# Patient Record
Sex: Female | Born: 1970 | Race: Asian | Hispanic: No | Marital: Married
Health system: Midwestern US, Community
[De-identification: ages and names within clinical notes are randomized; demographics above are authoritative.]

## PROBLEM LIST (undated history)

## (undated) DIAGNOSIS — I679 Cerebrovascular disease, unspecified: Secondary | ICD-10-CM

## (undated) DIAGNOSIS — Z227 Latent tuberculosis: Secondary | ICD-10-CM

## (undated) DIAGNOSIS — I639 Cerebral infarction, unspecified: Secondary | ICD-10-CM

## (undated) DIAGNOSIS — G8929 Other chronic pain: Secondary | ICD-10-CM

## (undated) DIAGNOSIS — H509 Unspecified strabismus: Secondary | ICD-10-CM

## (undated) DIAGNOSIS — R519 Headache, unspecified: Secondary | ICD-10-CM

## (undated) HISTORY — PX: CHOLECYSTECTOMY: SHX55

## (undated) HISTORY — DX: Latent tuberculosis: Z22.7

## (undated) HISTORY — DX: Cerebrovascular disease, unspecified: I67.9

## (undated) HISTORY — DX: Headache, unspecified: R51.9

## (undated) HISTORY — DX: Unspecified strabismus: H50.9

## (undated) HISTORY — DX: Other chronic pain: G89.29

## (undated) HISTORY — DX: Cerebral infarction, unspecified: I63.9

---

## 2019-12-12 ENCOUNTER — Emergency Department: Admit: 2019-12-12 | Payer: PRIVATE HEALTH INSURANCE

## 2019-12-12 ENCOUNTER — Inpatient Hospital Stay
Admit: 2019-12-12 | Discharge: 2019-12-13 | Disposition: A | Discharge status: Home / Self Care | Payer: PRIVATE HEALTH INSURANCE | Attending: Emergency Medicine

## 2019-12-12 DIAGNOSIS — R4182 Altered mental status, unspecified: Secondary | ICD-10-CM

## 2019-12-12 LAB — CBC WITH AUTO DIFFERENTIAL
Basophils %: 0 % (ref 0–1)
Basophils Absolute: 0 10*3/uL (ref 0.0–0.1)
Eosinophils %: 1 % (ref 0–7)
Eosinophils Absolute: 0 10*3/uL (ref 0.0–0.4)
Granulocyte Absolute Count: 0 10*3/uL (ref 0.00–0.04)
Hematocrit: 37.5 % (ref 35.0–47.0)
Hemoglobin: 12.4 g/dL (ref 11.5–16.0)
Immature Granulocytes: 0 % (ref 0–0.5)
Lymphocytes %: 27 % (ref 12–49)
Lymphocytes Absolute: 1 10*3/uL (ref 0.8–3.5)
MCH: 29.1 PG (ref 26.0–34.0)
MCHC: 33.1 g/dL (ref 30.0–36.5)
MCV: 88 FL (ref 80.0–99.0)
MPV: 9.1 FL (ref 8.9–12.9)
Monocytes %: 6 % (ref 5–13)
Monocytes Absolute: 0.2 10*3/uL (ref 0.0–1.0)
NRBC Absolute: 0 10*3/uL (ref 0.00–0.01)
Neutrophils %: 66 % (ref 32–75)
Neutrophils Absolute: 2.4 10*3/uL (ref 1.8–8.0)
Nucleated RBCs: 0 PER 100 WBC
Platelets: 187 10*3/uL (ref 150–400)
RBC: 4.26 M/uL (ref 3.80–5.20)
RDW: 11.7 % (ref 11.5–14.5)
WBC: 3.7 10*3/uL (ref 3.6–11.0)

## 2019-12-12 LAB — COMPREHENSIVE METABOLIC PANEL
ALT: 31 U/L (ref 12–78)
ALT: 39 U/L (ref 12–78)
AST: 286 U/L — ABNORMAL HIGH (ref 15–37)
AST: 346 U/L — ABNORMAL HIGH (ref 15–37)
Albumin/Globulin Ratio: 0.8 — ABNORMAL LOW (ref 1.1–2.2)
Albumin/Globulin Ratio: 0.9 — ABNORMAL LOW (ref 1.1–2.2)
Albumin: 3.4 g/dL — ABNORMAL LOW (ref 3.5–5.0)
Albumin: 3.5 g/dL (ref 3.5–5.0)
Alkaline Phosphatase: 101 U/L (ref 45–117)
Alkaline Phosphatase: 111 U/L (ref 45–117)
Anion Gap: 7 mmol/L (ref 5–15)
Anion Gap: 7 mmol/L (ref 5–15)
BUN: 21 mg/dL — ABNORMAL HIGH (ref 6–20)
BUN: 22 mg/dL — ABNORMAL HIGH (ref 6–20)
Bun/Cre Ratio: 37 — ABNORMAL HIGH (ref 12–20)
Bun/Cre Ratio: 38 — ABNORMAL HIGH (ref 12–20)
CO2: 25 mmol/L (ref 21–32)
CO2: 25 mmol/L (ref 21–32)
Calcium: 8.5 mg/dL (ref 8.5–10.1)
Calcium: 8.8 mg/dL (ref 8.5–10.1)
Chloride: 103 mmol/L (ref 97–108)
Chloride: 111 mmol/L — ABNORMAL HIGH (ref 97–108)
Creatinine: 0.56 mg/dL (ref 0.55–1.02)
Creatinine: 0.6 mg/dL (ref 0.55–1.02)
EGFR IF NonAfrican American: >60 mL/min/{1.73_m2} (ref >60)
EGFR IF NonAfrican American: >60 mL/min/{1.73_m2} (ref >60)
GFR African American: >60 mL/min/{1.73_m2} (ref >60)
GFR African American: >60 mL/min/{1.73_m2} (ref >60)
Globulin: 3.8 g/dL (ref 2.0–4.0)
Globulin: 4.2 g/dL — ABNORMAL HIGH (ref 2.0–4.0)
Glucose: 92 mg/dL (ref 65–100)
Glucose: 96 mg/dL (ref 65–100)
Potassium: 3.7 mmol/L (ref 3.5–5.1)
Potassium: 4 mmol/L (ref 3.5–5.1)
Sodium: 135 mmol/L — ABNORMAL LOW (ref 136–145)
Sodium: 143 mmol/L (ref 136–145)
Total Bilirubin: 0.9 mg/dL (ref 0.2–1.0)
Total Bilirubin: 1.2 mg/dL — ABNORMAL HIGH (ref 0.2–1.0)
Total Protein: 7.3 g/dL (ref 6.4–8.2)
Total Protein: 7.6 g/dL (ref 6.4–8.2)

## 2019-12-12 LAB — SALICYLATE
Salicylate level: <1.7 mg/dL — ABNORMAL LOW (ref 2.8–20.0)
Salicylate: <1.7 mg/dL — ABNORMAL LOW (ref 2.8–20.0)

## 2019-12-12 LAB — URINALYSIS WITH MICROSCOPIC
BACTERIA, URINE: NEGATIVE /hpf
Bilirubin, Urine: NEGATIVE
Blood, Urine: NEGATIVE
Glucose, Ur: NEGATIVE mg/dL
Ketones, Urine: NEGATIVE mg/dL
Leukocyte Esterase, Urine: NEGATIVE
Nitrite, Urine: NEGATIVE
Protein, UA: NEGATIVE mg/dL
Specific Gravity, UA: 1.03 (ref 1.003–1.030)
Urobilinogen, UA, POCT: 0.1 EU/dL (ref 0.1–1.0)
pH, UA: 7 (ref 5.0–8.0)

## 2019-12-12 LAB — DRUG SCREEN, URINE
AMPHETAMINES: NEGATIVE
Amphetamine Screen, Urine: NEGATIVE
BARBITURATES: NEGATIVE
BENZODIAZEPINES: NEGATIVE
Barbiturate Screen, Urine: NEGATIVE
Benzodiazepine Screen, Urine: NEGATIVE
COCAINE: NEGATIVE
Cocaine Screen Urine: NEGATIVE
METHADONE: NEGATIVE
Methadone Screen, Urine: NEGATIVE
OPIATES: NEGATIVE
Opiate Screen, Urine: NEGATIVE
PCP Screen, Urine: NEGATIVE
PCP(PHENCYCLIDINE): NEGATIVE
THC (TH-CANNABINOL): NEGATIVE
THC Screen, Urine: NEGATIVE

## 2019-12-12 LAB — COVID-19, RAPID: SARS-CoV-2, Rapid: NOT DETECTED

## 2019-12-12 LAB — EKG 12-LEAD
Atrial Rate: 64 {beats}/min
Diagnosis: NORMAL
P Axis: 39 degrees
P-R Interval: 162 ms
Q-T Interval: 392 ms
QRS Duration: 98 ms
QTc Calculation (Bazett): 404 ms
R Axis: 19 degrees
T Axis: 45 degrees
Ventricular Rate: 64 {beats}/min

## 2019-12-12 LAB — POCT GLUCOSE: POC Glucose: 85 mg/dL (ref 65–117)

## 2019-12-12 LAB — ACETAMINOPHEN LEVEL: Acetaminophen Level: <10 ug/mL — ABNORMAL LOW (ref 10–30)

## 2019-12-12 LAB — TROPONIN: Troponin I: <0.05 ng/mL (ref <0.05)

## 2019-12-12 LAB — ETHYL ALCOHOL
ALCOHOL(ETHYL),SERUM: <4 mg/dL (ref <10)
Ethyl Alcohol: <4 mg/dL (ref <10)

## 2019-12-12 LAB — METABOLIC PANEL, COMPREHENSIVE
A-G Ratio: 0.8 — ABNORMAL LOW (ref 1.1–2.2)
A-G Ratio: 0.9 — ABNORMAL LOW (ref 1.1–2.2)
ALT (SGPT): 31 U/L (ref 12–78)
ALT (SGPT): 39 U/L (ref 12–78)
AST (SGOT): 286 U/L — ABNORMAL HIGH (ref 15–37)
AST (SGOT): 346 U/L — ABNORMAL HIGH (ref 15–37)
Albumin: 3.4 g/dL — ABNORMAL LOW (ref 3.5–5.0)
Albumin: 3.5 g/dL (ref 3.5–5.0)
Alk. phosphatase: 101 U/L (ref 45–117)
Alk. phosphatase: 111 U/L (ref 45–117)
Anion gap: 7 mmol/L (ref 5–15)
Anion gap: 7 mmol/L (ref 5–15)
BUN/Creatinine ratio: 37 — ABNORMAL HIGH (ref 12–20)
BUN/Creatinine ratio: 38 — ABNORMAL HIGH (ref 12–20)
BUN: 21 mg/dL — ABNORMAL HIGH (ref 6–20)
BUN: 22 mg/dL — ABNORMAL HIGH (ref 6–20)
Bilirubin, total: 0.9 mg/dL (ref 0.2–1.0)
Bilirubin, total: 1.2 mg/dL — ABNORMAL HIGH (ref 0.2–1.0)
CO2: 25 mmol/L (ref 21–32)
CO2: 25 mmol/L (ref 21–32)
Calcium: 8.5 mg/dL (ref 8.5–10.1)
Calcium: 8.8 mg/dL (ref 8.5–10.1)
Chloride: 103 mmol/L (ref 97–108)
Chloride: 111 mmol/L — ABNORMAL HIGH (ref 97–108)
Creatinine: 0.56 mg/dL (ref 0.55–1.02)
Creatinine: 0.6 mg/dL (ref 0.55–1.02)
GFR est AA: >60 mL/min/{1.73_m2} (ref >60)
GFR est AA: >60 mL/min/{1.73_m2} (ref >60)
GFR est non-AA: >60 mL/min/{1.73_m2} (ref >60)
GFR est non-AA: >60 mL/min/{1.73_m2} (ref >60)
Globulin: 3.8 g/dL (ref 2.0–4.0)
Globulin: 4.2 g/dL — ABNORMAL HIGH (ref 2.0–4.0)
Glucose: 92 mg/dL (ref 65–100)
Glucose: 96 mg/dL (ref 65–100)
Potassium: 3.7 mmol/L (ref 3.5–5.1)
Potassium: 4 mmol/L (ref 3.5–5.1)
Protein, total: 7.3 g/dL (ref 6.4–8.2)
Protein, total: 7.6 g/dL (ref 6.4–8.2)
Sodium: 135 mmol/L — ABNORMAL LOW (ref 136–145)
Sodium: 143 mmol/L (ref 136–145)

## 2019-12-12 LAB — ACETAMINOPHEN: Acetaminophen level: <10 ug/mL — ABNORMAL LOW (ref 10–30)

## 2019-12-12 LAB — CBC WITH AUTOMATED DIFF
ABS. BASOPHILS: 0 10*3/uL (ref 0.0–0.1)
ABS. EOSINOPHILS: 0 10*3/uL (ref 0.0–0.4)
ABS. IMM. GRANS.: 0 10*3/uL (ref 0.00–0.04)
ABS. LYMPHOCYTES: 1 10*3/uL (ref 0.8–3.5)
ABS. MONOCYTES: 0.2 10*3/uL (ref 0.0–1.0)
ABS. NEUTROPHILS: 2.4 10*3/uL (ref 1.8–8.0)
ABSOLUTE NRBC: 0 10*3/uL (ref 0.00–0.01)
BASOPHILS: 0 % (ref 0–1)
EOSINOPHILS: 1 % (ref 0–7)
HCT: 37.5 % (ref 35.0–47.0)
HGB: 12.4 g/dL (ref 11.5–16.0)
IMMATURE GRANULOCYTES: 0 % (ref 0–0.5)
LYMPHOCYTES: 27 % (ref 12–49)
MCH: 29.1 PG (ref 26.0–34.0)
MCHC: 33.1 g/dL (ref 30.0–36.5)
MCV: 88 FL (ref 80.0–99.0)
MONOCYTES: 6 % (ref 5–13)
MPV: 9.1 FL (ref 8.9–12.9)
NEUTROPHILS: 66 % (ref 32–75)
NRBC: 0 PER 100 WBC
PLATELET: 187 10*3/uL (ref 150–400)
RBC: 4.26 M/uL (ref 3.80–5.20)
RDW: 11.7 % (ref 11.5–14.5)
WBC: 3.7 10*3/uL (ref 3.6–11.0)

## 2019-12-12 LAB — URINALYSIS W/MICROSCOPIC
Bacteria: NEGATIVE /hpf
Bilirubin: NEGATIVE
Blood: NEGATIVE
Glucose: NEGATIVE mg/dL
Ketone: NEGATIVE mg/dL
Leukocyte Esterase: NEGATIVE
Nitrites: NEGATIVE
Protein: NEGATIVE mg/dL
Specific gravity: 1.03 (ref 1.003–1.030)
Urobilinogen: 0.1 EU/dL (ref 0.1–1.0)
pH (UA): 7 (ref 5.0–8.0)

## 2019-12-12 LAB — EKG, 12 LEAD, INITIAL
Atrial Rate: 64 {beats}/min
Calculated P Axis: 39 degrees
Calculated R Axis: 19 degrees
Calculated T Axis: 45 degrees
Diagnosis: NORMAL
P-R Interval: 162 ms
Q-T Interval: 392 ms
QRS Duration: 98 ms
QTC Calculation (Bezet): 404 ms
Ventricular Rate: 64 {beats}/min

## 2019-12-12 LAB — GLUCOSE, POC: Glucose (POC): 85 mg/dL (ref 65–117)

## 2019-12-12 LAB — TROPONIN I: Troponin-I, Qt.: <0.05 ng/mL (ref <0.05)

## 2019-12-12 LAB — COVID-19 RAPID TEST: COVID-19 rapid test: NOT DETECTED

## 2019-12-12 MED ORDER — KETOROLAC TROMETHAMINE 15 MG/ML INJECTION
15 mg/mL | Freq: Once | INTRAMUSCULAR | Status: AC
Start: 2019-12-12 — End: 2019-12-12
  Administered 2019-12-12: 21:00:00 via INTRAVENOUS

## 2019-12-12 MED ORDER — IOPAMIDOL 76 % IV SOLN
370 mg iodine /mL (76 %) | Freq: Once | INTRAVENOUS | Status: AC
Start: 2019-12-12 — End: 2019-12-12
  Administered 2019-12-12: 16:00:00 via INTRAVENOUS

## 2019-12-12 MED ORDER — SODIUM CHLORIDE 0.9% BOLUS IV
0.9 % | Freq: Once | INTRAVENOUS | Status: AC
Start: 2019-12-12 — End: 2019-12-12
  Administered 2019-12-12: 21:00:00 via INTRAVENOUS

## 2019-12-12 MED FILL — KETOROLAC TROMETHAMINE 15 MG/ML INJECTION: 15 mg/mL | INTRAMUSCULAR | Qty: 1

## 2019-12-12 MED FILL — ISOVUE-370  76 % INTRAVENOUS SOLUTION: 370 mg iodine /mL (76 %) | INTRAVENOUS | Qty: 100

## 2019-12-12 MED FILL — SODIUM CHLORIDE 0.9 % IV: INTRAVENOUS | Qty: 1000

## 2019-12-12 NOTE — ED Notes (Signed)
Sarah at [employer] to be called for pt transportation at [phone]

## 2019-12-12 NOTE — Consults (Signed)
[facility]    Name:  Janice King, Janice King  MR#:  [id number]  DOB:  January 23, 1971  ACCOUNT #:  [id number]  DATE OF SERVICE:  12/13/2019    ORDERING PHYSICIANS:  Henderson Cloud, MD, and Alfonzo Beers, MD    REASON FOR CONSULTATION:  Depression.    HISTORY OF PRESENT ILLNESS:  This is a 49 year old married [place] female, a refugee at [place], brought to the emergency room via EMS for altered mental status, left pupil larger than the right, stroke alert with last known wellness 09:00, translator present, her son.  The patient was seen being transported directly to the CT.  When I saw, I already spoke with Dr. Terance Ice and also spoke to her son who speaks [place], Urdu, Hindi, able to express.  Basically, she is an Electrical engineer refugee, at [employer] for a month, she still has her husband, I believe, couple of children, she worries about them.  She primarily reports headache, neck stiffness, some stiffness of the right side of the jaw, constipation, and abdominal pain.  Of course, depression and worried.  Denied suicidal thoughts.  Denied hallucination, delusion, paranoia.  Emotionally withdrawn.  Energy level is low.  Apparently at [facility], she got 25 mg of amitriptyline at night and sertraline 50 mg at night.  She felt strange, funny, confused.  She and the son are not happy.  He reports it almost killed her, etc.  Apparently, she had a similar problem along with depression with stress in [place].  She took the medication, it worked well, but the son does not have the medication and does not know the name of the medication.  Per son, she was also sleeping all the time in the daytime but not sleeping at night.  After falling asleep for half an hour, wakes up.  Per son, she was deemed to overdose, she was not feeling good.  The patient's son felt that it really badly adversely affected her.  He does not want anything to do with the medication or any explanation.  Workup in the emergency room  was negative.  Apparently not slept in four days.  The patient basically wants to sleep at night.  Appetite poor.  She enjoys walking, she still walks.  Apparently, she has not slept for four days.    ALLERGIES TO MEDICATIONS:  NO KNOWN ALLERGIES TO MEDICATION.    MENTAL STATUS EXAMINATION:  Average height, medium-built female patient, resting, apathetic, withdrawn.  Did respond to the questions when son explained to her.  No agitation, no aggression.  Some withdrawal, depression, dysphoric.  Limited eye contact.  Does not appear to be having any hallucinations, delusions, paranoia.  No suicidal thoughts.  No homicidal thoughts.  Memory recall is fair.  IQ about average.  Insight limited.  Judgment fair.    DIAGNOSES:  AXIS I:  Adjustment disorder with depression and anxiety, I guess tension headaches, and constipation.    DISPOSITION:  At length, I talked to the son, explained anxiety, stress, and tension headache.  To give Remeron 7.5 mg, we are giving the lowest dose to help with the sleep at night and then clonazepam 0.25 mg twice a day as needed p.r.n. to help relax the muscle and subsequently tension headaches attributing to the component.  Supportive therapy.    LABORATORY DATA:  CBC unremarkable.  Metabolic panel:  Slightly low sodium at 135, BUN 22.  Liver enzymes:  Bilirubin elevated at 1.2, AST 346.  I will do an  ammonia level, B12 level, folate level, TSH.  Troponin 0.05.  COVID negative.  Glucose 85.  Urinalysis unremarkable.  Drug screen negative.  Tylenol level less than 10.  Salicylate less than 1.6.  Alcohol level less than 4.  Metabolic panel, repeat, chloride 111. Liver enzymes:  Bilirubin 0.9, AST 286.  Other liver enzymes are normal.  Urine culture pending.  Lipid panel pending.    PHYSICAL EXAMINATION:  VITAL SIGNS:  Today, temperature 99, pulse 67, blood pressure is 108/70, respirations 16 continually.    She needs some support and a therapist, psychiatrist, if possible an Afghani-speaking  person.      Karilyn Cota, MD      [id number]  D:  12/13/2019 14:32  T:  12/13/2019 20:42  JOB #:  [id number]

## 2019-12-12 NOTE — H&P (Signed)
H&P by Crawford Givens, MD at 12/12/19 2146                Author: Crawford Givens, MD  Service: Hospitalist  Author Type: Physician       Filed: 12/13/19 0550  Date of Service: 12/12/19 2146  Status: Signed          Editor: Crawford Givens, MD (Physician)               History and Physical                             Subjective :     Chief Complaint : Change in mental status, just sleeping all the time.  She was fine this morning around  9:00.      Source of information : Son at bedside, patient is sleeping in the bed.        History of present illness:     49 year old female looks older than her stated age presents to the emergency room with the son complaining of change in mental status.  She is and often refusing, here with  the one son.  Has 3 other children and husband left back in the country so she is under stress all the time.  The medications he is showing me suggest that she has already problem when she was in her country taking medications.  She was started on amitriptyline  and Zoloft yesterday and took first time dose.  She did not overdose as per the son.  Since then she was not feeling good and it made her really bad as per the son.  I explained him the medication amitriptyline just like the 1 that she is taking back  home but he would not get that point and keep complaining medications here are causing the problem.  He did not want to disturb the mother, and she is sleeping comfortably.  Work-up in the emergency room is completely negative.      Son admitted that she is not sleeping properly since she is here and for the last 4 days no sleep at all.  When I tried to explain him may be that is reason once she took the medication relaxed and slept but he think otherwise.        Past Medical History:        Diagnosis  Date         ?  Depression            Past Surgical History:         Procedure  Laterality  Date          ?  HX CHOLECYSTECTOMY              Family History        Family history unknown: Yes           Social History          Tobacco Use         ?  Smoking status:  Never Smoker     ?  Smokeless tobacco:  Never Used       Substance Use Topics         ?  Alcohol use:  Never            Prior to Admission medications             Medication  Sig  Start Date  End Date  Taking?  Authorizing Provider            amitriptyline (ELAVIL) 25 mg tablet  Take 25 mg by mouth nightly.      Yes  Other, Phys, MD            sertraline (ZOLOFT) 50 mg tablet  Take 50 mg by mouth daily.      Yes  Other, Phys, MD        No Known Allergies                 Review of Systems: Unable to obtain due to patient condition         Vitals:        Patient Vitals for the past 12 hrs:            Temp  Pulse  Resp  BP  SpO2            12/12/19 1830  --  68  18  114/66  99 %            12/12/19 1700  --  72  22  111/67  100 %     12/12/19 1530  --  66  18  120/78  98 %     12/12/19 1430  --  70  14  118/62  96 %     12/12/19 1343  --  65  20  111/70  97 %     12/12/19 1315  --  68  18  119/75  98 %     12/12/19 1243  98.3 ??F (36.8 ??C)  --  --  --  --     12/12/19 1230  --  66  16  112/66  97 %            12/12/19 1200  --  80  16  120/83  98 %           Physical Exam:    General : Looks comfortable, no respiratory distress noted.   HEENT : PERRLA, normal oral mucosa, atraumatic normocephalic, Normal ear and nose.   Neck : Supple, no JVD, no masses noted, no carotid bruit.   Lungs : Breath sounds with good air entry bilaterally, no wheezes or rales, no accessory muscle use.   CVS : Rhythm rate regular, S1+, S2+, no murmur or gallop.   Abdomen : Soft, nontender, no organomegaly, bowel sounds active.   Extremities : No edema noted,  pedal pulses palpable.   Musculoskeletal : Fair range of motion, no joint swelling or effusion, muscle tone and power appears normal.    Skin : Moist, warm, skin turgor, no pathological rash.   Lymphatic : No cervical lymphadenopathy.   Neurological : Seems sleeping comfortably in the bed     psychiatric : Unable to evaluate.         Data Review:      Recent Results (from the past 24 hour(s))     CBC WITH AUTOMATED DIFF          Collection Time: 12/12/19 11:45 AM         Result  Value  Ref Range            WBC  3.7  3.6 - 11.0 K/uL       RBC  4.26  3.80 - 5.20 M/uL       HGB  12.4  11.5 - 16.0 g/dL  HCT  37.5  35.0 - 47.0 %       MCV  88.0  80.0 - 99.0 FL       MCH  29.1  26.0 - 34.0 PG       MCHC  33.1  30.0 - 36.5 g/dL       RDW  11.7  11.5 - 14.5 %       PLATELET  187  150 - 400 K/uL       MPV  9.1  8.9 - 12.9 FL       NRBC  0.0  0.0 PER 100 WBC       ABSOLUTE NRBC  0.00  0.00 - 0.01 K/uL       NEUTROPHILS  66  32 - 75 %       LYMPHOCYTES  27  12 - 49 %       MONOCYTES  6  5 - 13 %       EOSINOPHILS  1  0 - 7 %       BASOPHILS  0  0 - 1 %       IMMATURE GRANULOCYTES  0  0 - 0.5 %       ABS. NEUTROPHILS  2.4  1.8 - 8.0 K/UL       ABS. LYMPHOCYTES  1.0  0.8 - 3.5 K/UL       ABS. MONOCYTES  0.2  0.0 - 1.0 K/UL       ABS. EOSINOPHILS  0.0  0.0 - 0.4 K/UL       ABS. BASOPHILS  0.0  0.0 - 0.1 K/UL       ABS. IMM. GRANS.  0.0  0.00 - 0.04 K/UL       DF  AUTOMATED          METABOLIC PANEL, COMPREHENSIVE          Collection Time: 12/12/19 11:45 AM         Result  Value  Ref Range            Sodium  135 (L)  136 - 145 mmol/L       Potassium  3.7  3.5 - 5.1 mmol/L       Chloride  103  97 - 108 mmol/L       CO2  25  21 - 32 mmol/L       Anion gap  7  5 - 15 mmol/L       Glucose  96  65 - 100 mg/dL       BUN  22 (H)  6 - 20 mg/dL       Creatinine  0.60  0.55 - 1.02 mg/dL       BUN/Creatinine ratio  37 (H)  12 - 20         GFR est AA  >60  >60 ml/min/1.26m       GFR est non-AA  >60  >60 ml/min/1.751m      Calcium  8.5  8.5 - 10.1 mg/dL       Bilirubin, total  1.2 (H)  0.2 - 1.0 mg/dL       AST (SGOT)  346 (H)  15 - 37 U/L       ALT (SGPT)  31  12 - 78 U/L       Alk. phosphatase  101  45 - 117 U/L       Protein, total  7.6  6.4 -  8.2 g/dL       Albumin  3.4 (L)  3.5 - 5.0 g/dL       Globulin  4.2 (H)  2.0  - 4.0 g/dL       A-G Ratio  0.8 (L)  1.1 - 2.2         TROPONIN I          Collection Time: 12/12/19 11:45 AM         Result  Value  Ref Range            Troponin-I, Qt.  <0.05  <0.05 ng/mL       COVID-19 RAPID TEST          Collection Time: 12/12/19 12:12 PM         Result  Value  Ref Range            Specimen source  Please find results under separate order          COVID-19 rapid test  Not Detected  Not Detected         EKG, 12 LEAD, INITIAL          Collection Time: 12/12/19 12:30 PM         Result  Value  Ref Range            Ventricular Rate  64  BPM       Atrial Rate  64  BPM       P-R Interval  162  ms       QRS Duration  98  ms       Q-T Interval  392  ms       QTC Calculation (Bezet)  404  ms       Calculated P Axis  39  degrees       Calculated R Axis  19  degrees       Calculated T Axis  45  degrees       Diagnosis                 Normal sinus rhythm   Normal ECG   No previous ECGs available   Confirmed by AMIN, [place] ([id number]) on 12/12/2019 3:18:52 PM          GLUCOSE, POC          Collection Time: 12/12/19 12:39 PM         Result  Value  Ref Range            Glucose (POC)  85  65 - 117 mg/dL       Performed by  Thressa Sheller W/MICROSCOPIC          Collection Time: 12/12/19 12:45 PM         Result  Value  Ref Range            Color  Yellow/Straw          Appearance  Clear  Clear         Specific gravity  1.030  1.003 - 1.030         pH (UA)  7.0  5.0 - 8.0         Protein  Negative  Negative mg/dL       Glucose  Negative  Negative mg/dL       Ketone  Negative  Negative mg/dL       Bilirubin  Negative  Negative  Blood  Negative  Negative         Urobilinogen  0.1  0.1 - 1.0 EU/dL       Nitrites  Negative  Negative         Leukocyte Esterase  Negative  Negative         WBC  0-4  0 - 4 /hpf       RBC  0-5  0 - 5 /hpf       Bacteria  Negative  Negative /hpf       DRUG SCREEN, URINE          Collection Time: 12/12/19 12:45 PM         Result  Value  Ref Range             AMPHETAMINES  Negative  Negative         BARBITURATES  Negative  Negative         BENZODIAZEPINES  Negative  Negative         COCAINE  Negative  Negative         METHADONE  Negative  Negative         OPIATES  Negative  Negative         PCP(PHENCYCLIDINE)  Negative  Negative         THC (TH-CANNABINOL)  Negative  Negative         Drug screen comment                  This test is a screen for drugs of abuse in a medical setting only (i.e., they are unconfirmed results and as such must not be used for non-medical purposes, e.g.,employment testing, legal  testing). Due to its inherent nature, false positive (FP) and false negative (FN) results may be obtained. Therefore, if necessary for medical care, recommend confirmation of positive findings by GC/MS.       ACETAMINOPHEN          Collection Time: 12/12/19 12:45 PM         Result  Value  Ref Range            Acetaminophen level  <10 (L)  10 - 30 ug/mL       Reported dose date  Dose Dependent          Reported dose:  Dose Dependent  Units       SALICYLATE          Collection Time: 12/12/19 12:45 PM         Result  Value  Ref Range            Salicylate level  <0.1 (L)  2.8 - 20.0 mg/dL       Reported dose date  Dose Dependent          Reported dose:  Dose Dependent  Units       ETHYL ALCOHOL          Collection Time: 12/12/19 12:45 PM         Result  Value  Ref Range            ALCOHOL(ETHYL),SERUM  <4  <10 mg/dL       METABOLIC PANEL, COMPREHENSIVE          Collection Time: 12/12/19  6:55 PM         Result  Value  Ref Range            Sodium  143  136 -  145 mmol/L       Potassium  4.0  3.5 - 5.1 mmol/L       Chloride  111 (H)  97 - 108 mmol/L       CO2  25  21 - 32 mmol/L       Anion gap  7  5 - 15 mmol/L       Glucose  92  65 - 100 mg/dL       BUN  21 (H)  6 - 20 mg/dL       Creatinine  0.56  0.55 - 1.02 mg/dL       BUN/Creatinine ratio  38 (H)  12 - 20         GFR est AA  >60  >60 ml/min/1.29m       GFR est non-AA  >60  >60 ml/min/1.762m      Calcium  8.8  8.5 -  10.1 mg/dL       Bilirubin, total  0.9  0.2 - 1.0 mg/dL       AST (SGOT)  286 (H)  15 - 37 U/L       ALT (SGPT)  39  12 - 78 U/L       Alk. phosphatase  111  45 - 117 U/L       Protein, total  7.3  6.4 - 8.2 g/dL       Albumin  3.5  3.5 - 5.0 g/dL       Globulin  3.8  2.0 - 4.0 g/dL            A-G Ratio  0.9 (L)  1.1 - 2.2             Radiologic Studies :      CT Results  (Last 48 hours)                                    12/12/19 1144    CT ABD PELV W CONT  Final result            Impression:    1. Bilateral lower lobe, left greater than right.      2. Poor distention of a portion of the rectum with no adjacent inflammation.      Recommend clinical evaluation.      3. Otherwise No acute abdominal or pelvic findings.                  Narrative:    Axial images from the lung bases to the pubic symphysis were obtained following      intravenous administration of 100 mL Isovue-370. Sagittal and coronal      reformatted images were reviewed. All CT scans at this facility are performed      using dose reduction optimization techniques as appropriate to a performed exam      including the following:   automated exposure control, adjustments of the mA      and/or kV according to patient size, or use of iterative reconstruction      technique.             INDICATION: Nausea and vomiting.             Bilateral lower lobe airspace disease, left greater than right. No effusions.      Calcified granulomas are seen in the liver. Gallbladder appears to be absent.  Spleen, pancreas and adrenal glands exhibit no focal abnormalities. Abdominal      aorta is normal in caliber and contour. No retroperitoneal adenopathy. Kidneys      enhance symmetrically shows a few small probable cortical cysts. GI tract shows      no evidence of obstruction. Normal appendix. Large quantity of fecal material is      seen in the rectosigmoid colon. Portions of the rectum appears thick-walled. No      significant adjacent inflammation or pelvic  adenopathy. Uterus is unremarkable.      Urinary bladder images normally. No free fluid in the pelvis. Bone windows and      reconstructed images show no acute bony findings.                          12/12/19 1126    CT CODE NEURO HEAD WO CONTRAST  Final result            Impression:    1.  No acute intracranial abnormality.      2.  Chronic appearing left maxillary sinusitis.             The findings were discussed with Dr. Redmond Pulling by Dr. Archie Balboa at 11:36 AM on      12/12/2019 via phone conversation.                       Narrative:    Study: Head CT without contrast.             Clinical indication: Stroke alert.             Technique: Routine volume acquisition of the head was obtained without IV      contrast. Coronal and sagittal reformations were generated in soft tissue and      bone kernels. Dose reduction: All CT scans at this facility are performed using      dose reduction optimization techniques as appropriate to a performed exam      including the following: Automated exposure control, adjustments of the mA      and/or kV according to patient size, or use of iterative reconstruction      technique.             Comparison: None available.             Findings:              No evidence of acute intracranial hemorrhage, mass effect, midline shift or      abnormal extra-axial fluid collection. Ventricles and basal cisterns are      preserved and are symmetric. Gray-white matter differentiation is maintained.             No evidence of skull fracture. Chronic appearing left maxillary sinusitis.      Mastoid air cells are clear. Globes and orbits are intact.                                        CXR Results  (Last 48 hours)                                    12/12/19 1412    XR CHEST PORT  Final result  Impression:    Atelectasis or infiltrate at the lung bases.                       Narrative:    Semiupright portable radiograph chest 12:05 p.m.. No prior study.             INDICATION: Shortness of  breath.             Heart size is within normal limits. Increased density the lung bases may      represent atelectasis or infiltrate.. No effusion or pneumothorax. No acute bony      findings.                                       Assessment and Plan :        Altered mental status: I doubt that it is altered mental status, she seems just comfortably sleeping.  But due to new incident we will keep her for observation and monitor closely.      History of anxiety and depression: On treatment which may need to be restarted.  As per patient request wanted somebody from his culture to be able to understand so consultation is placed to Dr. Georgina Snell.      Admitted to medical floor for observation, full CODE STATUS, home medications reviewed and verified.  Son is the decision maker for her has no advance medical directives.         CC : None      Signed By:  Crawford Givens, MD           December 12, 2019         This dictation was done by dragon, computer voice recognition software.  Often unanticipated grammatical, syntax, Homer phones and other interpretive errors are inadvertently transcribed.  Please excuse errors that have  escaped final proofreading.

## 2019-12-12 NOTE — Progress Notes (Signed)
The purpose of the visit was in response to a STROKE ALERT on the patient. The patient was being attended to by the med-team. The chaplain provided the ministry of presence and on the unit.    Chaplain Charlett Lango D.Min, M.Div.  Chaplain can be reached by calling the operator at [facility]  [phone]

## 2019-12-12 NOTE — ED Notes (Signed)
PT arrived via EMS for AMS, L pupil larger than R, Stroke alert with LKW 0900. Translator present. Pt seen bedside by MD and transported directly to CT.

## 2019-12-12 NOTE — ED Notes (Signed)
All questions asked via [employer] interpreter for NIH stroke scale

## 2019-12-12 NOTE — ED Provider Notes (Signed)
ED Provider Notes by Pearson Forster, DO at 12/12/19 1236                Author: Pearson Forster, DO  Service: --  Author Type: Physician       Filed: 12/12/19 2244  Date of Service: 12/12/19 1236  Status: Signed          Editor: Pearson Forster, DO (Physician)               EMERGENCY DEPARTMENT HISTORY AND PHYSICAL EXAM           Date: 12/12/2019   Patient Name: Janice King           History of Presenting Illness          Chief Complaint       Patient presents with        ?  Stroke           History Provided By: Patient, Patient's Son and Translator      HPI: Janice King,  49 y.o. female with a past medical history significant  No significant past medical history presents to the ED with cc of altered mental status.  Patient reportedly acting and feeling normal up until 9 AM this morning.  According to son, patient was reporting  some abdominal pain and shortness of breath prior to the onset of her symptoms.  Son also states that patient was started on sertraline and amitriptyline with her first dose last night.  He states that there is no way that she could have taken too much  of these medications.  Patient reports feeling fatigued and with generalized weakness.      There are no other complaints, changes, or physical findings at this time.      PCP: None        Current Facility-Administered Medications             Medication  Dose  Route  Frequency  Provider  Last Rate  Last Admin              ?  0.9% sodium chloride infusion   50 mL/hr  IntraVENous  CONTINUOUS  Crawford Givens, MD           ?  sodium chloride (NS) flush 5-40 mL   5-40 mL  IntraVENous  Q8H  Crawford Givens, MD           ?  sodium chloride (NS) flush 5-40 mL   5-40 mL  IntraVENous  PRN  Crawford Givens, MD           ?  acetaminophen (TYLENOL) tablet 650 mg   650 mg  Oral  Q4H PRN  Crawford Givens, MD                    ?  ondansetron (ZOFRAN) injection 4 mg   4 mg  IntraVENous  Q6H PRN  Crawford Givens, MD                Current Outpatient Medications          Medication  Sig  Dispense  Refill           ?  amitriptyline (ELAVIL) 25 mg tablet  Take 25 mg by mouth nightly.               ?  sertraline (ZOLOFT) 50 mg tablet  Take 50 mg by mouth daily.  Past History        Past Medical History:     Past Medical History:        Diagnosis  Date         ?  Depression             Past Surgical History:     Past Surgical History:         Procedure  Laterality  Date          ?  HX CHOLECYSTECTOMY               Family History:     Family History       Family history unknown: Yes           Social History:     Social History          Tobacco Use         ?  Smoking status:  Never Smoker     ?  Smokeless tobacco:  Never Used       Substance Use Topics         ?  Alcohol use:  Never         ?  Drug use:  Never           Allergies:   No Known Allergies           Review of Systems        Review of Systems    Constitutional: Positive for activity change, appetite change  and fatigue. Negative for chills and fever.    HENT: Negative for congestion and sore throat.     Eyes: Negative for pain and visual disturbance.    Respiratory: Negative for cough and shortness of breath.     Cardiovascular: Negative for chest pain and palpitations.    Gastrointestinal: Positive for abdominal pain and nausea . Negative for constipation, diarrhea and vomiting.    Endocrine: Negative for cold intolerance and heat intolerance.    Genitourinary: Negative for dysuria and frequency.    Musculoskeletal: Negative for arthralgias and myalgias.    Skin: Negative for color change and rash.    Allergic/Immunologic: Negative for environmental allergies and food allergies.    Neurological: Positive for weakness. Negative for dizziness, light-headedness and headaches.    Hematological: Does not bruise/bleed easily.    Psychiatric/Behavioral: Negative for agitation and confusion.            Physical Exam        Physical Exam   Constitutional  :        Appearance: She is ill-appearing.   HENT:       Head: Normocephalic and atraumatic.      Right Ear: External ear normal.      Left Ear: External ear normal.      Nose: Nose normal.      Mouth/Throat:      Mouth: Mucous membranes are moist.   Eyes:       Extraocular Movements: Extraocular movements intact.      Conjunctiva/sclera: Conjunctivae normal.      Pupils: Pupils are equal, round, and reactive to light.      Comments: Esotropia     Cardiovascular:       Rate and Rhythm: Normal rate and regular rhythm.      Pulses: Normal pulses.      Heart sounds: Normal heart sounds.    Pulmonary:       Effort: Pulmonary effort is normal.  Breath sounds: Normal breath sounds.   Abdominal :      General: Abdomen is flat. There is no distension.      Palpations: Abdomen is soft.      Tenderness: There is no abdominal tenderness.     Musculoskeletal:          General: Normal range of motion.      Cervical back: Normal range of motion.    Skin:      General: Skin is warm and dry.      Capillary Refill: Capillary refill takes less than 2 seconds.    Neurological:       General: No focal deficit present.      Mental Status: She is alert and oriented to person, place, and time. Mental status is at baseline.   Psychiatric:         Mood and Affect: Mood normal.         Behavior: Behavior normal.               Lab and Diagnostic Study Results        Labs -         Recent Results (from the past 12 hour(s))     CBC WITH AUTOMATED DIFF          Collection Time: 12/12/19 11:45 AM         Result  Value  Ref Range            WBC  3.7  3.6 - 11.0 K/uL       RBC  4.26  3.80 - 5.20 M/uL       HGB  12.4  11.5 - 16.0 g/dL       HCT  37.5  35.0 - 47.0 %       MCV  88.0  80.0 - 99.0 FL       MCH  29.1  26.0 - 34.0 PG       MCHC  33.1  30.0 - 36.5 g/dL       RDW  11.7  11.5 - 14.5 %       PLATELET  187  150 - 400 K/uL       MPV  9.1  8.9 - 12.9 FL       NRBC  0.0  0.0 PER 100 WBC       ABSOLUTE NRBC  0.00  0.00 - 0.01 K/uL        NEUTROPHILS  66  32 - 75 %       LYMPHOCYTES  27  12 - 49 %       MONOCYTES  6  5 - 13 %       EOSINOPHILS  1  0 - 7 %       BASOPHILS  0  0 - 1 %       IMMATURE GRANULOCYTES  0  0 - 0.5 %       ABS. NEUTROPHILS  2.4  1.8 - 8.0 K/UL       ABS. LYMPHOCYTES  1.0  0.8 - 3.5 K/UL       ABS. MONOCYTES  0.2  0.0 - 1.0 K/UL       ABS. EOSINOPHILS  0.0  0.0 - 0.4 K/UL       ABS. BASOPHILS  0.0  0.0 - 0.1 K/UL       ABS. IMM. GRANS.  0.0  0.00 - 0.04 K/UL  DF  AUTOMATED          METABOLIC PANEL, COMPREHENSIVE          Collection Time: 12/12/19 11:45 AM         Result  Value  Ref Range            Sodium  135 (L)  136 - 145 mmol/L       Potassium  3.7  3.5 - 5.1 mmol/L       Chloride  103  97 - 108 mmol/L       CO2  25  21 - 32 mmol/L       Anion gap  7  5 - 15 mmol/L       Glucose  96  65 - 100 mg/dL       BUN  22 (H)  6 - 20 mg/dL       Creatinine  0.60  0.55 - 1.02 mg/dL       BUN/Creatinine ratio  37 (H)  12 - 20         GFR est AA  >60  >60 ml/min/1.61m       GFR est non-AA  >60  >60 ml/min/1.761m      Calcium  8.5  8.5 - 10.1 mg/dL       Bilirubin, total  1.2 (H)  0.2 - 1.0 mg/dL       AST (SGOT)  346 (H)  15 - 37 U/L       ALT (SGPT)  31  12 - 78 U/L       Alk. phosphatase  101  45 - 117 U/L       Protein, total  7.6  6.4 - 8.2 g/dL       Albumin  3.4 (L)  3.5 - 5.0 g/dL       Globulin  4.2 (H)  2.0 - 4.0 g/dL       A-G Ratio  0.8 (L)  1.1 - 2.2         TROPONIN I          Collection Time: 12/12/19 11:45 AM         Result  Value  Ref Range            Troponin-I, Qt.  <0.05  <0.05 ng/mL       COVID-19 RAPID TEST          Collection Time: 12/12/19 12:12 PM         Result  Value  Ref Range            Specimen source  Please find results under separate order          COVID-19 rapid test  Not Detected  Not Detected         EKG, 12 LEAD, INITIAL          Collection Time: 12/12/19 12:30 PM         Result  Value  Ref Range            Ventricular Rate  64  BPM       Atrial Rate  64  BPM       P-R Interval  162  ms        QRS Duration  98  ms       Q-T Interval  392  ms       QTC Calculation (Bezet)  404  ms  Calculated P Axis  39  degrees       Calculated R Axis  19  degrees       Calculated T Axis  45  degrees       Diagnosis                 Normal sinus rhythm   Normal ECG   No previous ECGs available   Confirmed by AMIN, MITESH ([id number]) on 12/12/2019 3:18:52 PM          GLUCOSE, POC          Collection Time: 12/12/19 12:39 PM         Result  Value  Ref Range            Glucose (POC)  85  65 - 117 mg/dL       Performed by  Thressa Sheller W/MICROSCOPIC          Collection Time: 12/12/19 12:45 PM         Result  Value  Ref Range            Color  Yellow/Straw          Appearance  Clear  Clear         Specific gravity  1.030  1.003 - 1.030         pH (UA)  7.0  5.0 - 8.0         Protein  Negative  Negative mg/dL       Glucose  Negative  Negative mg/dL       Ketone  Negative  Negative mg/dL       Bilirubin  Negative  Negative         Blood  Negative  Negative         Urobilinogen  0.1  0.1 - 1.0 EU/dL       Nitrites  Negative  Negative         Leukocyte Esterase  Negative  Negative         WBC  0-4  0 - 4 /hpf       RBC  0-5  0 - 5 /hpf       Bacteria  Negative  Negative /hpf       DRUG SCREEN, URINE          Collection Time: 12/12/19 12:45 PM         Result  Value  Ref Range            AMPHETAMINES  Negative  Negative         BARBITURATES  Negative  Negative         BENZODIAZEPINES  Negative  Negative         COCAINE  Negative  Negative         METHADONE  Negative  Negative         OPIATES  Negative  Negative         PCP(PHENCYCLIDINE)  Negative  Negative         THC (TH-CANNABINOL)  Negative  Negative         Drug screen comment                  This test is a screen for drugs of abuse in a medical setting only (i.e., they are unconfirmed results and as such must not be used for non-medical  purposes, e.g.,employment testing, legal testing).  Due to its inherent nature, false positive (FP) and false negative  (FN) results may be obtained. Therefore, if necessary for medical care, recommend confirmation of positive findings by GC/MS.       ACETAMINOPHEN          Collection Time: 12/12/19 12:45 PM         Result  Value  Ref Range            Acetaminophen level  <10 (L)  10 - 30 ug/mL       Reported dose date  Dose Dependent          Reported dose:  Dose Dependent  Units       SALICYLATE          Collection Time: 12/12/19 12:45 PM         Result  Value  Ref Range            Salicylate level  <9.3 (L)  2.8 - 20.0 mg/dL       Reported dose date  Dose Dependent          Reported dose:  Dose Dependent  Units       ETHYL ALCOHOL          Collection Time: 12/12/19 12:45 PM         Result  Value  Ref Range            ALCOHOL(ETHYL),SERUM  <4  <10 mg/dL       METABOLIC PANEL, COMPREHENSIVE          Collection Time: 12/12/19  6:55 PM         Result  Value  Ref Range            Sodium  143  136 - 145 mmol/L       Potassium  4.0  3.5 - 5.1 mmol/L       Chloride  111 (H)  97 - 108 mmol/L       CO2  25  21 - 32 mmol/L       Anion gap  7  5 - 15 mmol/L       Glucose  92  65 - 100 mg/dL       BUN  21 (H)  6 - 20 mg/dL       Creatinine  0.56  0.55 - 1.02 mg/dL       BUN/Creatinine ratio  38 (H)  12 - 20         GFR est AA  >60  >60 ml/min/1.84m       GFR est non-AA  >60  >60 ml/min/1.765m      Calcium  8.8  8.5 - 10.1 mg/dL       Bilirubin, total  0.9  0.2 - 1.0 mg/dL       AST (SGOT)  286 (H)  15 - 37 U/L       ALT (SGPT)  39  12 - 78 U/L       Alk. phosphatase  111  45 - 117 U/L       Protein, total  7.3  6.4 - 8.2 g/dL       Albumin  3.5  3.5 - 5.0 g/dL       Globulin  3.8  2.0 - 4.0 g/dL            A-G Ratio  0.9 (L)  1.1 - 2.2  Radiologic Studies -    '@LASTXRRESULT'$ @     CT Results   (Last 48 hours)                                    12/12/19 1144    CT ABD PELV W CONT  Final result            Impression:    1. Bilateral lower lobe, left greater than right.      2. Poor distention of a portion of the rectum with no  adjacent inflammation.      Recommend clinical evaluation.      3. Otherwise No acute abdominal or pelvic findings.                       Narrative:    Axial images from the lung bases to the pubic symphysis were obtained following      intravenous administration of 100 mL Isovue-370. Sagittal and coronal      reformatted images were reviewed. All CT scans at this facility are performed      using dose reduction optimization techniques as appropriate to a performed exam      including the following:   automated exposure control, adjustments of the mA      and/or kV according to patient size, or use of iterative reconstruction      technique.             INDICATION: Nausea and vomiting.             Bilateral lower lobe airspace disease, left greater than right. No effusions.      Calcified granulomas are seen in the liver. Gallbladder appears to be absent.      Spleen, pancreas and adrenal glands exhibit no focal abnormalities. Abdominal      aorta is normal in caliber and contour. No retroperitoneal adenopathy. Kidneys      enhance symmetrically shows a few small probable cortical cysts. GI tract shows      no evidence of obstruction. Normal appendix. Large quantity of fecal material is      seen in the rectosigmoid colon. Portions of the rectum appears thick-walled. No      significant adjacent inflammation or pelvic adenopathy. Uterus is unremarkable.      Urinary bladder images normally. No free fluid in the pelvis. Bone windows and      reconstructed images show no acute bony findings.                               12/12/19 1126    CT CODE NEURO HEAD WO CONTRAST  Final result            Impression:    1.  No acute intracranial abnormality.      2.  Chronic appearing left maxillary sinusitis.             The findings were discussed with Dr. Redmond Pulling by Dr. Archie Balboa at 11:36 AM on      12/12/2019 via phone conversation.                       Narrative:    Study: Head CT without contrast.             Clinical  indication: Stroke alert.  Technique: Routine volume acquisition of the head was obtained without IV      contrast. Coronal and sagittal reformations were generated in soft tissue and      bone kernels. Dose reduction: All CT scans at this facility are performed using      dose reduction optimization techniques as appropriate to a performed exam      including the following: Automated exposure control, adjustments of the mA      and/or kV according to patient size, or use of iterative reconstruction      technique.             Comparison: None available.             Findings:              No evidence of acute intracranial hemorrhage, mass effect, midline shift or      abnormal extra-axial fluid collection. Ventricles and basal cisterns are      preserved and are symmetric. Gray-white matter differentiation is maintained.             No evidence of skull fracture. Chronic appearing left maxillary sinusitis.      Mastoid air cells are clear. Globes and orbits are intact.                                        CXR Results   (Last 48 hours)                                    12/12/19 1412    XR CHEST PORT  Final result            Impression:    Atelectasis or infiltrate at the lung bases.                       Narrative:    Semiupright portable radiograph chest 12:05 p.m.. No prior study.             INDICATION: Shortness of breath.             Heart size is within normal limits. Increased density the lung bases may      represent atelectasis or infiltrate.. No effusion or pneumothorax. No acute bony      findings.                                    Medical Decision Making and ED Course     - I am the first and primary provider for this patient AND AM THE PRIMARY PROVIDER OF RECORD.      - I reviewed the vital signs, available nursing notes, past medical history, past surgical history, family history and social history.      - Initial assessment performed. The patients presenting problems have been discussed,  and the staff are in agreement with the care plan formulated and outlined with them.  I have encouraged them to ask questions as they arise throughout their visit.      Vital Signs-Reviewed the patient's vital signs.      Patient Vitals for the past 12 hrs:            Temp  Pulse  Resp  BP  SpO2            12/12/19 1830  --  68  18  114/66  99 %            12/12/19 1700  --  72  22  111/67  100 %     12/12/19 1530  --  66  18  120/78  98 %     12/12/19 1430  --  70  14  118/62  96 %     12/12/19 1343  --  65  20  111/70  97 %     12/12/19 1315  --  68  18  119/75  98 %     12/12/19 1243  98.3 ??F (36.8 ??C)  --  --  --  --     12/12/19 1230  --  66  16  112/66  97 %            12/12/19 1200  --  80  16  120/83  98 %           EKG interpretation: (Preliminary): Performed at 1230, and read at 1233   Sinus rhythm with a rate of 64, PR 162, QRS 98, QTc 4 4 without evidence of ST depression or elevation.  Normal axis.         The patient presents with altered mental status with a differential diagnosis of  cerebral hemorrhage, CVA, hypernatremia, hyponatremia, ICB, UTI, volume depletion and medication side effect.   I do not suspect CVA given patient's nonfocal examination.      ED Course:                    Provider Notes (Medical Decision Making):    Patient is a 49 year old female who presents to the emergency department for evaluation of altered mental status, generalized fatigue and weakness.   CT head and CTA head neck were within normal limits.   Blood work significant only for elevated AST.   Patient was started on sertraline and amitriptyline last night.  I spoke with poison control who recommended no further testing or interventions.  Patient with persistent fatigue and generalized weakness after IV fluids.  She will be admitted for observation  by Dr. Garnette Gunner   MDM                  Consultations:           Consultations: -  Hospitalist Consultant: Dr. Garnette Gunner: We have asked for emergent  assistance with  regard to this patient. We have discussed the patients HPI, ROS, PE and results this far.  They will come and evaluate the patient for admission.              Procedures and Critical Care           Performed by: Pearson Forster, DO   Procedures               Disposition        Disposition: Admitted to Observation Unit the case was discussed with the admitting physician K[place]rag[place]      Admitted           Diagnosis        Clinical Impression:       1.  Altered mental status, unspecified altered mental status type            Attestations:      Pearson Forster, DO  Please note that this dictation was completed with Dragon, the computer voice recognition software.  Quite often unanticipated grammatical, syntax, homophones, and other interpretive errors are inadvertently  transcribed by the computer software.  Please disregard these errors.  Please excuse any errors that have escaped final proofreading.  Thank you.

## 2019-12-13 LAB — COMPREHENSIVE METABOLIC PANEL
ALT: 33 U/L (ref 12–78)
AST: 171 U/L — ABNORMAL HIGH (ref 15–37)
Albumin/Globulin Ratio: 0.8 — ABNORMAL LOW (ref 1.1–2.2)
Albumin: 3.1 g/dL — ABNORMAL LOW (ref 3.5–5.0)
Alkaline Phosphatase: 100 U/L (ref 45–117)
Anion Gap: 7 mmol/L (ref 5–15)
BUN: 22 mg/dL — ABNORMAL HIGH (ref 6–20)
Bun/Cre Ratio: 45 — ABNORMAL HIGH (ref 12–20)
CO2: 25 mmol/L (ref 21–32)
Calcium: 8.7 mg/dL (ref 8.5–10.1)
Chloride: 110 mmol/L — ABNORMAL HIGH (ref 97–108)
Creatinine: 0.49 mg/dL — ABNORMAL LOW (ref 0.55–1.02)
EGFR IF NonAfrican American: >60 mL/min/{1.73_m2} (ref >60)
GFR African American: >60 mL/min/{1.73_m2} (ref >60)
Globulin: 4 g/dL (ref 2.0–4.0)
Glucose: 79 mg/dL (ref 65–100)
Potassium: 3.6 mmol/L (ref 3.5–5.1)
Sodium: 142 mmol/L (ref 136–145)
Total Bilirubin: 1 mg/dL (ref 0.2–1.0)
Total Protein: 7.1 g/dL (ref 6.4–8.2)

## 2019-12-13 LAB — MAGNESIUM
Magnesium: 2 mg/dL (ref 1.6–2.4)
Magnesium: 2 mg/dL (ref 1.6–2.4)

## 2019-12-13 LAB — TSH 3RD GENERATION
TSH: 0.75 u[IU]/mL (ref 0.36–3.74)
TSH: 0.75 u[IU]/mL (ref 0.36–3.74)

## 2019-12-13 LAB — TROPONIN: Troponin I: <0.05 ng/mL (ref <0.05)

## 2019-12-13 LAB — TROPONIN I: Troponin-I, Qt.: <0.05 ng/mL (ref <0.05)

## 2019-12-13 LAB — METABOLIC PANEL, COMPREHENSIVE
A-G Ratio: 0.8 — ABNORMAL LOW (ref 1.1–2.2)
ALT (SGPT): 33 U/L (ref 12–78)
AST (SGOT): 171 U/L — ABNORMAL HIGH (ref 15–37)
Albumin: 3.1 g/dL — ABNORMAL LOW (ref 3.5–5.0)
Alk. phosphatase: 100 U/L (ref 45–117)
Anion gap: 7 mmol/L (ref 5–15)
BUN/Creatinine ratio: 45 — ABNORMAL HIGH (ref 12–20)
BUN: 22 mg/dL — ABNORMAL HIGH (ref 6–20)
Bilirubin, total: 1 mg/dL (ref 0.2–1.0)
CO2: 25 mmol/L (ref 21–32)
Calcium: 8.7 mg/dL (ref 8.5–10.1)
Chloride: 110 mmol/L — ABNORMAL HIGH (ref 97–108)
Creatinine: 0.49 mg/dL — ABNORMAL LOW (ref 0.55–1.02)
GFR est AA: >60 mL/min/{1.73_m2} (ref >60)
GFR est non-AA: >60 mL/min/{1.73_m2} (ref >60)
Globulin: 4 g/dL (ref 2.0–4.0)
Glucose: 79 mg/dL (ref 65–100)
Potassium: 3.6 mmol/L (ref 3.5–5.1)
Protein, total: 7.1 g/dL (ref 6.4–8.2)
Sodium: 142 mmol/L (ref 136–145)

## 2019-12-13 MED ORDER — MIRTAZAPINE 7.5 MG TAB
7.5 mg | ORAL_TABLET | Freq: Every evening | ORAL | 2 refills | Status: AC
Start: 2019-12-13 — End: ?

## 2019-12-13 MED ORDER — SODIUM CHLORIDE 0.9 % IV
INTRAVENOUS | Status: DC
Start: 2019-12-13 — End: 2019-12-13
  Administered 2019-12-13: 04:00:00 via INTRAVENOUS

## 2019-12-13 MED ORDER — CLONAZEPAM 0.5 MG TAB
0.5 mg | Freq: Two times a day (BID) | ORAL | Status: DC | PRN
Start: 2019-12-13 — End: 2019-12-13

## 2019-12-13 MED ORDER — DOCUSATE SODIUM 100 MG CAP
100 mg | Freq: Every day | ORAL | Status: DC
Start: 2019-12-13 — End: 2019-12-13

## 2019-12-13 MED ORDER — MAGNESIUM HYDROXIDE 400 MG/5 ML ORAL SUSP
400 mg/5 mL | Freq: Every day | ORAL | Status: DC | PRN
Start: 2019-12-13 — End: 2019-12-13

## 2019-12-13 MED ORDER — CLONAZEPAM 0.5 MG TAB
0.5 mg | ORAL_TABLET | Freq: Two times a day (BID) | ORAL | 1 refills | Status: DC | PRN
Start: 2019-12-13 — End: 2019-12-13

## 2019-12-13 MED ORDER — SODIUM CHLORIDE 0.9 % IJ SYRG
INTRAMUSCULAR | Status: DC | PRN
Start: 2019-12-13 — End: 2019-12-13

## 2019-12-13 MED ORDER — CLONAZEPAM 0.5 MG TAB
0.5 mg | ORAL_TABLET | Freq: Two times a day (BID) | ORAL | 1 refills | Status: AC | PRN
Start: 2019-12-13 — End: ?

## 2019-12-13 MED ORDER — ONDANSETRON (PF) 4 MG/2 ML INJECTION
4 mg/2 mL | Freq: Four times a day (QID) | INTRAMUSCULAR | Status: DC | PRN
Start: 2019-12-13 — End: 2019-12-13

## 2019-12-13 MED ORDER — SODIUM CHLORIDE 0.9 % IJ SYRG
Freq: Three times a day (TID) | INTRAMUSCULAR | Status: DC
Start: 2019-12-13 — End: 2019-12-13
  Administered 2019-12-13: 04:00:00 via INTRAVENOUS

## 2019-12-13 MED ORDER — MIRTAZAPINE 15 MG TAB
15 mg | Freq: Every evening | ORAL | Status: DC
Start: 2019-12-13 — End: 2019-12-13

## 2019-12-13 MED ORDER — ACETAMINOPHEN 325 MG TABLET
325 mg | ORAL | Status: DC | PRN
Start: 2019-12-13 — End: 2019-12-13

## 2019-12-13 MED FILL — SODIUM CHLORIDE 0.9 % IV: INTRAVENOUS | Qty: 1000

## 2019-12-13 MED FILL — SODIUM CHLORIDE 0.9 % IJ SYRG: INTRAMUSCULAR | Qty: 40

## 2019-12-13 NOTE — Discharge Summary (Signed)
Physician Discharge Summary     Patient ID:    Janice King  [id number]  49 y.o.  [birth year]    Admit date: Jan 03, 2020    Discharge date : 12/13/2019    Chronic Diagnoses:    Problem List as of 12/13/2019 Date Reviewed: Jan 03, 2020        Codes Class Noted - Resolved    Altered mental status ICD-10-CM: R41.82  ICD-9-CM: 780.97  Jan 03, 2020 - Present          22    Final Diagnoses:   Altered mental status [R41.82]    Reason for Hospitalization:  Acute encephalopathy   Anxiety and depression      Hospital Course:   49 year old female looks older than her stated age presents to the emergency room with the son complaining of change in mental status.  She is and often refusing, here with the one son.  Has 3 other children and husband left back in the country so she is under stress all the time.  The medications he is showing me suggest that she has already problem when she was in her country taking medications.  She was started on amitriptyline and Zoloft yesterday and took first time dose.  She did not overdose as per the son.  Since then she was not feeling good and it made her really bad as per the son.  I explained him the medication amitriptyline just like the 1 that she is taking back home but he would not get that point and keep complaining medications here are causing the problem.  He did not want to disturb the mother, and she is sleeping comfortably.  Work-up in the emergency room is completely negative.  ??  Son admitted that she is not sleeping properly since she is here and for the last 4 days no sleep at all.     CT head negative  Consulted psych and neuro    He was planned for discharge with the following instructions      INSTRUCTIONS ON DISCHARGE     (1) please take REMERON 7.5mg  at night- this is for your sleep    (2) if you have severe anxiety- neck tightness/headache, please take clonazepam 0.25mg  twice a day as needed for severe anxiety. Please remember this medication may cause  drowsiness.    (3) F/u with Dr. Lujean Amel in 1-2 weeks        Discharge Medications:   Current Discharge Medication List      START taking these medications    Details   mirtazapine (REMERON) 7.5 mg tablet Take 1 Tablet by mouth nightly.  Qty: 30 Tablet, Refills: 2  Start date: 12/13/2019      clonazePAM (KlonoPIN) 0.5 mg tablet Take 0.5 Tablets by mouth two (2) times daily as needed for Anxiety. Max Daily Amount: 0.5 mg.  Qty: 15 Tablet, Refills: 1  Start date: 12/13/2019    Associated Diagnoses: Altered mental status, unspecified altered mental status type         STOP taking these medications       amitriptyline (ELAVIL) 25 mg tablet Comments:   Reason for Stopping:         sertraline (ZOLOFT) 50 mg tablet Comments:   Reason for Stopping:                 Follow up Care:    1. None in 1-2 weeks.  Please call to set up an appointment shortly after discharge.  Diet: regular    Disposition:  {home    Advanced Directive:   FULL s   DNR      Discharge Exam:  General : Looks comfortable, no respiratory distress noted.  HEENT : PERRLA, normal oral mucosa, atraumatic normocephalic, Normal ear and nose.  Neck : Supple, no JVD, no masses noted, no carotid bruit.  Lungs : Breath sounds with good air entry bilaterally, no wheezes or rales, no accessory muscle use.  CVS : Rhythm rate regular, S1+, S2+, no murmur or gallop.  Abdomen : Soft, nontender, no organomegaly, bowel sounds active.  Extremities : No edema noted,  pedal pulses palpable.  Musculoskeletal : Fair range of motion, no joint swelling or effusion, muscle tone and power appears normal.   Skin : Moist, warm, skin turgor, no pathological rash.  Lymphatic : No cervical lymphadenopathy.  Neurological : Seems sleeping comfortably in the bed   psychiatric : Unable to evaluate.    CONSULTATIONS: psych and neuro    Significant Diagnostic Studies:     Radiologic Studies -   Results from Hospital Encounter encounter on 12/12/19    XR CHEST PORT    Narrative  Semiupright  portable radiograph chest 12:05 p.m.. No prior study.    INDICATION: Shortness of breath.    Heart size is within normal limits. Increased density the lung bases may  represent atelectasis or infiltrate.. No effusion or pneumothorax. No acute bony  findings.    Impression  Atelectasis or infiltrate at the lung bases.     CT Results  (Last 48 hours)               12/12/19 1144  CT ABD PELV W CONT Final result    Impression:  1. Bilateral lower lobe, left greater than right.   2. Poor distention of a portion of the rectum with no adjacent inflammation.   Recommend clinical evaluation.   3. Otherwise No acute abdominal or pelvic findings.       Narrative:  Axial images from the lung bases to the pubic symphysis were obtained following   intravenous administration of 100 mL Isovue-370. Sagittal and coronal   reformatted images were reviewed. All CT scans at this facility are performed   using dose reduction optimization techniques as appropriate to a performed exam   including the following:   automated exposure control, adjustments of the mA   and/or kV according to patient size, or use of iterative reconstruction   technique.       INDICATION: Nausea and vomiting.       Bilateral lower lobe airspace disease, left greater than right. No effusions.   Calcified granulomas are seen in the liver. Gallbladder appears to be absent.   Spleen, pancreas and adrenal glands exhibit no focal abnormalities. Abdominal   aorta is normal in caliber and contour. No retroperitoneal adenopathy. Kidneys   enhance symmetrically shows a few small probable cortical cysts. GI tract shows   no evidence of obstruction. Normal appendix. Large quantity of fecal material is   seen in the rectosigmoid colon. Portions of the rectum appears thick-walled. No   significant adjacent inflammation or pelvic adenopathy. Uterus is unremarkable.   Urinary bladder images normally. No free fluid in the pelvis. Bone windows and   reconstructed images show no  acute bony findings.           12/12/19 1126  CT CODE NEURO HEAD WO CONTRAST Final result    Impression:  1.  No acute  intracranial abnormality.   2.  Chronic appearing left maxillary sinusitis.       The findings were discussed with Dr. Andrey Campanile by Dr. Derrill Kay at 11:36 AM on   12/12/2019 via phone conversation.       Narrative:  Study: Head CT without contrast.       Clinical indication: Stroke alert.       Technique: Routine volume acquisition of the head was obtained without IV   contrast. Coronal and sagittal reformations were generated in soft tissue and   bone kernels. Dose reduction: All CT scans at this facility are performed using   dose reduction optimization techniques as appropriate to a performed exam   including the following: Automated exposure control, adjustments of the mA   and/or kV according to patient size, or use of iterative reconstruction   technique.       Comparison: None available.       Findings:        No evidence of acute intracranial hemorrhage, mass effect, midline shift or   abnormal extra-axial fluid collection. Ventricles and basal cisterns are   preserved and are symmetric. Gray-white matter differentiation is maintained.       No evidence of skull fracture. Chronic appearing left maxillary sinusitis.   Mastoid air cells are clear. Globes and orbits are intact.                         Discharge time spent 35 minutes    Signed:  Alfonzo Beers, MD  12/13/2019  2:37 PM

## 2019-12-13 NOTE — Progress Notes (Signed)
Pt has discharge orders home self care. Spoke with attending he stated the pt is able to discharge today

## 2019-12-13 NOTE — Progress Notes (Signed)
Patient left via [facility] with son under care of military liaison.  No current signs or symptoms of pain or SOB>

## 2019-12-13 NOTE — Progress Notes (Signed)
Discharge plan of care/case management plan validated with provider discharge order.        Reviewed discharge instructions with pt mother. He verbalized understanding of instructions. Pt is alert, oriented and stable. Pt is without any signs of distress. Pt is going home with self care. Pt is leaving with  Prescriptions and was instructed to follow up with Dr. Lujean Amel in 2 weeks. Pt is leaving without an IV, tele or foley. Pt and son are awaiting a ride for the head [place] of housing.

## 2019-12-13 NOTE — Progress Notes (Signed)
Pt in her clothes, son at beside, monitor on, pt resting comfortably in bed

## 2019-12-13 NOTE — Consults (Signed)
NEUROLOGY CONSULT    Name Janice King Age 49 y.o.   MRN [id number] DOB [birth year]     Referring Physician: None      Chief Complaint: CVA     This is a 49 y.o. right handed [place] American female who was admitted through the ED overnight where she presented with an episode of decreased responsiveness after she took first dose of amitriptyline and Zoloft 25 and 50 mg respectively but no other symptoms.  According to the son at the bedside who worked as an Equities trader also she has been under a lot of stress since they were brought to this country, as the half of the family is still in [place], especially her husband and one of their son.  She had an extensive work-up in the ED including a CT scan of the head and a CTA of the head and the neck which were all normal and slowly the patient returned to her normal self.  At the time of my evaluation this morning the patient was feeling fine with no other symptoms.    Assessment and Plan:  1.  Decreased expansiveness: Secondary to the 2 psych medicines since started the same time including Zoloft 50 mg and amitriptyline 25 mg.  The patient was fine before she took the dose of these 2 medicines per son and her neurological examination is normal.  I doubt she had any neurological issues here.  But she already had $1 million work-up to her credit.  From neurology standpoint she can be discharged home today without any further work-up.  2.  Depression/anxiety/lot of stress: Due to the poor family situation, as she is here with her son, her husband and 1 son are in [place] and another son is in [place].  3.  Insomnia: She has it because of the stress she is going through and may be benefited by some light sleep aid.    I had a lengthy discussion with the son, educating him the fact that her examination is normal and she does not need any further testing, to which he agreed.    I will sign off the case for now, however, please do not hesitate to call if needed  again.    No Known Allergies     Current Facility-Administered Medications   Medication Dose Route Frequency   ??? clonazePAM (KlonoPIN) tablet 0.25 mg  0.25 mg Oral BID PRN   ??? mirtazapine (REMERON) tablet 7.5 mg  7.5 mg Oral QHS   ??? magnesium hydroxide (MILK OF MAGNESIA) 400 mg/5 mL oral suspension 30 mL  30 mL Oral DAILY PRN   ??? [START ON 12/14/2019] docusate sodium (COLACE) capsule 100 mg  100 mg Oral DAILY   ??? 0.9% sodium chloride infusion  50 mL/hr IntraVENous CONTINUOUS   ??? sodium chloride (NS) flush 5-40 mL  5-40 mL IntraVENous Q8H   ??? sodium chloride (NS) flush 5-40 mL  5-40 mL IntraVENous PRN   ??? acetaminophen (TYLENOL) tablet 650 mg  650 mg Oral Q4H PRN   ??? ondansetron (ZOFRAN) injection 4 mg  4 mg IntraVENous Q6H PRN     Past Medical History:   Diagnosis Date   ??? Depression      Social History     Tobacco Use   ??? Smoking status: Never Smoker   ??? Smokeless tobacco: Never Used   Substance Use Topics   ??? Alcohol use: Never   ??? Drug use: Never     Exam  Visit Vitals  BP 108/70   Pulse 67   Temp 99 ??F (37.2 ??C)   Resp 16   Ht 5' 6.14" (1.68 m)   Wt 64 kg (141 lb 1.5 oz)   SpO2 98%   BMI 22.68 kg/m??     General: Well developed, well nourished. Patient in no distress   Head: Normocephalic, atraumatic, anicteric sclera   Neck Normal ROM, No thyromegally   Lungs:  Clear to auscultation    Cardiac: Regular rate and rhythm with no murmurs.   Abd: Bowel sounds were audible   Ext: No pedal edema   Skin: Supple no rash   NeurologicExam:    Mental Status: Alert and oriented to person place and time   Speech: Fluent no aphasia or dysarthria.   Cranial Nerves:   Pupils are equal round and reactive to light and accommodation, extraocular movements are intact and full, visual fields are intact by confrontation, no nystagmus noted, face is symmetric, sensation on face is intact and symmetric, hearing is intact and symmetric, tongue and uvula are in midline with normal movements, palate is elevating equally, shoulder shrug is  intact and symmetric.   Motor:  Full and symmetric strength of upper and lower ext .Normal bulk and tone.    Reflexes:   Deep tendon reflexes 2+/4 and symmetric.   Sensory:   Symmetric and intact    Gait:  Gait is deferred.   Tremor:   No tremor noted.   Cerebellar:  Coordination intact for finger-nose-finger.    Neurovascular: No carotid bruits. No JVD      Lab Review  Lab Results   Component Value Date/Time    WBC 3.7 12/12/2019 11:45 AM    HCT 37.5 12/12/2019 11:45 AM    HGB 12.4 12/12/2019 11:45 AM    PLATELET 187 12/12/2019 11:45 AM     Lab Results   Component Value Date/Time    Sodium 142 12/13/2019 04:02 AM    Potassium 3.6 12/13/2019 04:02 AM    Chloride 110 (H) 12/13/2019 04:02 AM    CO2 25 12/13/2019 04:02 AM    Glucose 79 12/13/2019 04:02 AM    BUN 22 (H) 12/13/2019 04:02 AM    Creatinine 0.49 (L) 12/13/2019 04:02 AM    Calcium 8.7 12/13/2019 04:02 AM     No results found for: B12LT, FOL, RBCF  No results found for: LDL, LDLC, DLDLP  No results found for: HBA1C, HBA1CPOC, HBA1CEXT  No components found for: TROPQUANT  No results found for: ANA

## 2019-12-13 NOTE — ED Notes (Signed)
Pt not in gown states feels more comfortably in her cloths.

## 2019-12-13 NOTE — Progress Notes (Signed)
SON form discussed with the pts son.  Copy to med rec dept.

## 2019-12-13 NOTE — Progress Notes (Signed)
 TRANSFER - OUT REPORT:    Verbal report given to [place] on Janice King  being transferred to 2 east(unit) for routine progression of care       Report consisted of patient's Situation, Background, Assessment and   Recommendations(SBAR).     Information from the following report(s) SBAR, Kardex, MAR and Med Rec Status was reviewed with the receiving nurse.    Lines:   Peripheral IV 12/12/19 Right Antecubital (Active)   Site Assessment Clean, dry, & intact 12/12/19 1115   Phlebitis Assessment 0 12/12/19 1115   Dressing Status Clean, dry, & intact 12/12/19 1115   Dressing Type Tape;Transparent 12/12/19 1115   Hub Color/Line Status Pink 12/12/19 1115        Opportunity for questions and clarification was provided.      Patient transported with:   [employer]

## 2019-12-14 LAB — CULTURE, URINE
Culture result:: NO GROWTH
Culture: NO GROWTH

## 2019-12-14 LAB — LIPID PANEL
CHOL/HDL Ratio: 3.8 (ref 0.0–5.0)
Chol/HDL Ratio: 3.8 (ref 0.0–5.0)
Cholesterol, Total: 210 mg/dL — ABNORMAL HIGH (ref <200)
Cholesterol, total: 210 mg/dL — ABNORMAL HIGH (ref <200)
HDL Cholesterol: 55 mg/dL
HDL: 55 mg/dL
LDL Calculated: 145.2 mg/dL — ABNORMAL HIGH (ref 0–100)
LDL, calculated: 145.2 mg/dL — ABNORMAL HIGH (ref 0–100)
Triglyceride: 49 mg/dL (ref <150)
Triglycerides: 49 mg/dL (ref <150)
VLDL Cholesterol Calculated: 9.8 mg/dL
VLDL, calculated: 9.8 mg/dL

## 2019-12-14 LAB — FOLATE
Folate: 19.6 ng/mL (ref 5.0–21.0)
Folate: 19.6 ng/mL (ref 5.0–21.0)

## 2019-12-14 LAB — VITAMIN B12
Vitamin B-12: 732 pg/mL (ref 193–986)
Vitamin B12: 732 pg/mL (ref 193–986)

## 2019-12-15 LAB — EKG 12-LEAD
Atrial Rate: 64 {beats}/min
Diagnosis: NORMAL
P Axis: 45 degrees
P-R Interval: 168 ms
Q-T Interval: 430 ms
QRS Duration: 88 ms
QTc Calculation (Bazett): 443 ms
R Axis: 14 degrees
T Axis: 44 degrees
Ventricular Rate: 64 {beats}/min

## 2019-12-15 LAB — EKG, 12 LEAD, INITIAL
Atrial Rate: 64 {beats}/min
Calculated P Axis: 45 degrees
Calculated R Axis: 14 degrees
Calculated T Axis: 44 degrees
Diagnosis: NORMAL
P-R Interval: 168 ms
Q-T Interval: 430 ms
QRS Duration: 88 ms
QTC Calculation (Bezet): 443 ms
Ventricular Rate: 64 {beats}/min

## 2020-03-24 ENCOUNTER — Telehealth: Payer: Self-pay | Admitting: Family Medicine

## 2020-03-24 DIAGNOSIS — F4320 Adjustment disorder, unspecified: Secondary | ICD-10-CM

## 2020-03-24 MED ORDER — MIRTAZAPINE 7.5 MG PO TABS: 7.5000 mg | ORAL_TABLET | Freq: Every day | ORAL | 1 refills | Status: DC

## 2020-03-24 NOTE — Telephone Encounter (Signed)
Patient scheduled for visit in February 1. [facility] [employer] [facility], on remeron 7.5 mg nightly. She has had benefit from this. Received message from patient's current caregiver she needs refill. Refill to preferred pharmacy.  Terisa Starr, MD  Family Medicine Teaching Service

## 2020-04-07 ENCOUNTER — Other Ambulatory Visit: Payer: Self-pay

## 2020-04-07 ENCOUNTER — Other Ambulatory Visit: Payer: Self-pay | Admitting: Obstetrics and Gynecology

## 2020-04-07 ENCOUNTER — Ambulatory Visit
Admission: RE | Admit: 2020-04-07 | Discharge: 2020-04-07 | Disposition: A | Discharge status: Home / Self Care | Payer: No Typology Code available for payment source | Source: Ambulatory Visit | Attending: Obstetrics and Gynecology | Admitting: Obstetrics and Gynecology

## 2020-04-07 DIAGNOSIS — R7612 Nonspecific reaction to cell mediated immunity measurement of gamma interferon antigen response without active tuberculosis: Secondary | ICD-10-CM

## 2020-04-19 ENCOUNTER — Ambulatory Visit (INDEPENDENT_AMBULATORY_CARE_PROVIDER_SITE_OTHER): Payer: PRIVATE HEALTH INSURANCE | Admitting: Family Medicine

## 2020-04-19 ENCOUNTER — Other Ambulatory Visit: Payer: Self-pay

## 2020-04-19 ENCOUNTER — Other Ambulatory Visit: Payer: Self-pay | Admitting: Family Medicine

## 2020-04-19 VITALS — BP 104/72 | HR 70 | Ht 64.17 in | Wt 162.8 lb

## 2020-04-19 DIAGNOSIS — F4320 Adjustment disorder, unspecified: Secondary | ICD-10-CM

## 2020-04-19 DIAGNOSIS — Z227 Latent tuberculosis: Secondary | ICD-10-CM

## 2020-04-19 DIAGNOSIS — I679 Cerebrovascular disease, unspecified: Secondary | ICD-10-CM | POA: Diagnosis not present

## 2020-04-19 DIAGNOSIS — H509 Unspecified strabismus: Secondary | ICD-10-CM | POA: Insufficient documentation

## 2020-04-19 DIAGNOSIS — S46812A Strain of other muscles, fascia and tendons at shoulder and upper arm level, left arm, initial encounter: Secondary | ICD-10-CM

## 2020-04-19 DIAGNOSIS — Z0289 Encounter for other administrative examinations: Secondary | ICD-10-CM

## 2020-04-19 LAB — POCT URINALYSIS DIP (MANUAL ENTRY)
Bilirubin, UA: NEGATIVE
Glucose, UA: NEGATIVE mg/dL
Ketones, POC UA: NEGATIVE mg/dL
Nitrite, UA: NEGATIVE
Protein Ur, POC: NEGATIVE mg/dL
Spec Grav, UA: 1.025 (ref 1.010–1.025)
Urobilinogen, UA: 0.2 E.U./dL
pH, UA: 6 (ref 5.0–8.0)

## 2020-04-19 MED ORDER — DICLOFENAC SODIUM 1 % EX GEL: CUTANEOUS | 3 refills | Status: DC

## 2020-04-19 MED ORDER — MIRTAZAPINE 7.5 MG PO TABS: 7.5000 mg | ORAL_TABLET | Freq: Every day | ORAL | 3 refills | Status: DC

## 2020-04-19 MED ORDER — ALBENDAZOLE 200 MG PO TABS: 400.0000 mg | ORAL_TABLET | Freq: Once | ORAL | 0 refills | Status: AC

## 2020-04-19 MED ORDER — IVERMECTIN 3 MG PO TABS: 200.0000 ug/kg | ORAL_TABLET | Freq: Every day | ORAL | 0 refills | Status: AC

## 2020-04-19 NOTE — Assessment & Plan Note (Signed)
Referral to Ophtho--Dr. Maple Hudson

## 2020-04-19 NOTE — Progress Notes (Signed)
I discussed the plan of care with the resident physician and agree with below documentation.  Theola Cuellar, MD   

## 2020-04-19 NOTE — Patient Instructions (Addendum)
It was wonderful to see you today.  Please bring ALL of your medications with you to every visit.   Today we talked about:  --Getting your records from the Health Department  -- Picking up two medications at your pharmacy   -- Continue your medication to take at night   You should be called about an appointment--they speak your preferred language!    Thank you for choosing [facility].   Please call [phone] with any questions about today's appointment.  Please be sure to schedule follow up at the front  desk before you leave today.   Terisa Starr, MD  Family Medicine
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
## 2020-04-19 NOTE — Assessment & Plan Note (Signed)
Records requested from health department.

## 2020-04-19 NOTE — Assessment & Plan Note (Signed)
Will evaluate for risk factors Consider advanced imaging at follow up given chronic daily headaches May benefit from SSRI (citalopram) for chronic daily headaches and mood symptoms

## 2020-04-19 NOTE — Assessment & Plan Note (Signed)
Albendazole + Ivermectin prescribed per CDC guidelines Labs ordered and obtained Will call with results

## 2020-04-20 ENCOUNTER — Other Ambulatory Visit: Payer: Self-pay | Admitting: Family Medicine

## 2020-04-20 ENCOUNTER — Other Ambulatory Visit (INDEPENDENT_AMBULATORY_CARE_PROVIDER_SITE_OTHER): Payer: PRIVATE HEALTH INSURANCE | Admitting: Family Medicine

## 2020-04-20 DIAGNOSIS — R200 Anesthesia of skin: Secondary | ICD-10-CM | POA: Diagnosis not present

## 2020-04-20 DIAGNOSIS — I679 Cerebrovascular disease, unspecified: Secondary | ICD-10-CM

## 2020-04-20 LAB — POCT GLYCOSYLATED HEMOGLOBIN (HGB A1C): Hemoglobin A1C: 5.5 % (ref 4.0–5.6)

## 2020-04-21 ENCOUNTER — Telehealth: Payer: Self-pay | Admitting: Family Medicine

## 2020-04-21 DIAGNOSIS — E785 Hyperlipidemia, unspecified: Secondary | ICD-10-CM

## 2020-04-21 LAB — URINALYSIS, MICROSCOPIC ONLY
Bacteria, UA: NONE SEEN
Casts: NONE SEEN /lpf
Epithelial Cells (non renal): NONE SEEN /hpf (ref 0–10)
RBC, Urine: NONE SEEN /hpf (ref 0–2)
WBC, UA: NONE SEEN /hpf (ref 0–5)

## 2020-04-21 MED ORDER — ROSUVASTATIN CALCIUM 5 MG PO TABS: 5.0000 mg | ORAL_TABLET | Freq: Every evening | ORAL | 3 refills | Status: DC

## 2020-04-21 NOTE — Telephone Encounter (Signed)
Called son and he is at work. Discussed labs briefly, will call tomorrow AM.  Called sponsor per patient request, new Rx for statin to pharmacy. Will repeat in 4-6 weeks. LDL is >160, which indicates need to start statin. Also has a history of a stroke (presumed).   The 10-year ASCVD risk score Denman George DC Montez Hageman., et al., 2013) is: 1.2%   Values used to calculate the score:     Age: 50 years     Sex: Female     Is Non-Hispanic African American: No     Diabetic: No     Tobacco smoker: No     Systolic Blood Pressure: 104 mmHg     Is BP treated: No     HDL Cholesterol: 65 mg/dL     Total Cholesterol: 300 mg/dL  Terisa Starr, MD  Family Medicine Teaching Service

## 2020-04-22 ENCOUNTER — Telehealth: Payer: Self-pay | Admitting: Family Medicine

## 2020-04-22 LAB — LIPID PANEL
Chol/HDL Ratio: 4.6 ratio — ABNORMAL HIGH (ref 0.0–4.4)
Cholesterol, Total: 300 mg/dL — ABNORMAL HIGH (ref 100–199)
HDL: 65 mg/dL (ref >39)
LDL Chol Calc (NIH): 216 mg/dL — ABNORMAL HIGH (ref 0–99)
Triglycerides: 111 mg/dL (ref 0–149)
VLDL Cholesterol Cal: 19 mg/dL (ref 5–40)

## 2020-04-22 LAB — COMPREHENSIVE METABOLIC PANEL
ALT: 5 IU/L (ref 0–32)
AST: 30 IU/L (ref 0–40)
Albumin/Globulin Ratio: 1.4 (ref 1.2–2.2)
Albumin: 4.7 g/dL (ref 3.8–4.8)
Alkaline Phosphatase: 107 IU/L (ref 44–121)
BUN/Creatinine Ratio: 23 (ref 9–23)
BUN: 16 mg/dL (ref 6–24)
Bilirubin Total: 0.4 mg/dL (ref 0.0–1.2)
CO2: 21 mmol/L (ref 20–29)
Calcium: 10.1 mg/dL (ref 8.7–10.2)
Chloride: 104 mmol/L (ref 96–106)
Creatinine, Ser: 0.69 mg/dL (ref 0.57–1.00)
GFR calc Af Amer: 117 mL/min/{1.73_m2} (ref >59)
GFR calc non Af Amer: 102 mL/min/{1.73_m2} (ref >59)
Globulin, Total: 3.4 g/dL (ref 1.5–4.5)
Glucose: 99 mg/dL (ref 65–99)
Potassium: 4 mmol/L (ref 3.5–5.2)
Sodium: 142 mmol/L (ref 134–144)
Total Protein: 8.1 g/dL (ref 6.0–8.5)

## 2020-04-22 LAB — CBC WITH DIFFERENTIAL/PLATELET
Basophils Absolute: 0 10*3/uL (ref 0.0–0.2)
Basos: 1 %
EOS (ABSOLUTE): 0.1 10*3/uL (ref 0.0–0.4)
Eos: 1 %
Hematocrit: 40.3 % (ref 34.0–46.6)
Hemoglobin: 13.5 g/dL (ref 11.1–15.9)
Immature Grans (Abs): 0 10*3/uL (ref 0.0–0.1)
Immature Granulocytes: 0 %
Lymphocytes Absolute: 2 10*3/uL (ref 0.7–3.1)
Lymphs: 36 %
MCH: 29 pg (ref 26.6–33.0)
MCHC: 33.5 g/dL (ref 31.5–35.7)
MCV: 87 fL (ref 79–97)
Monocytes Absolute: 0.3 10*3/uL (ref 0.1–0.9)
Monocytes: 5 %
Neutrophils Absolute: 3.3 10*3/uL (ref 1.4–7.0)
Neutrophils: 57 %
Platelets: 271 10*3/uL (ref 150–450)
RBC: 4.65 x10E6/uL (ref 3.77–5.28)
RDW: 12.6 % (ref 11.7–15.4)
WBC: 5.7 10*3/uL (ref 3.4–10.8)

## 2020-04-22 LAB — VARICELLA ZOSTER ANTIBODY, IGG: Varicella zoster IgG: 1974 index (ref >165)

## 2020-04-22 LAB — TSH: TSH: 0.985 u[IU]/mL (ref 0.450–4.500)

## 2020-04-22 LAB — HEPATITIS B SURFACE ANTIBODY, QUANTITATIVE: Hepatitis B Surf Ab Quant: 6.6 m[IU]/mL — ABNORMAL LOW (ref >9.9)

## 2020-04-22 LAB — HEPATITIS B CORE ANTIBODY, TOTAL: Hep B Core Total Ab: NEGATIVE

## 2020-04-22 LAB — RPR: RPR Ser Ql: NONREACTIVE

## 2020-04-22 LAB — HEPATITIS B SURFACE ANTIGEN: Hepatitis B Surface Ag: NEGATIVE

## 2020-04-22 NOTE — Telephone Encounter (Signed)
Called with Dari interpreter.  LDL is >190, must start statin. Spoke with sponsor about this yesterday per patient's reqest.   Rosuvastatin to pharmacy.  Patient did not answer, left message.  Will try again on next week.  Terisa Starr, MD  Family Medicine Teaching Service

## 2020-04-23 ENCOUNTER — Telehealth: Payer: Self-pay | Admitting: Family Medicine

## 2020-04-23 DIAGNOSIS — F419 Anxiety disorder, unspecified: Secondary | ICD-10-CM

## 2020-04-23 NOTE — Telephone Encounter (Signed)
Called with Dari interpreter.  Clarified patient's preferred name and number.  Prefers [facility] [place] rather than [place].  [phone]  Reviewed results. LDL >160, history of stroke (?), Rx for crestor. Reviewed at length. Will plan to increase as she tolerates.  During conversation, patient and son report they are amenable to psychiatry referral for history of suspected PTSD/anxiety syndrome.  Terisa Starr, MD  Family Medicine Teaching Service

## 2020-04-24 LAB — VARICELLA ZOSTER ANTIBODY, IGG

## 2020-04-26 LAB — LIPID PANEL

## 2020-04-26 LAB — CBC WITH DIFFERENTIAL/PLATELET

## 2020-05-06 ENCOUNTER — Ambulatory Visit: Payer: No Typology Code available for payment source | Admitting: Family Medicine

## 2020-05-13 DIAGNOSIS — H5213 Myopia, bilateral: Secondary | ICD-10-CM | POA: Diagnosis not present

## 2020-05-25 DIAGNOSIS — R7612 Nonspecific reaction to cell mediated immunity measurement of gamma interferon antigen response without active tuberculosis: Secondary | ICD-10-CM | POA: Diagnosis not present

## 2020-05-25 DIAGNOSIS — Z111 Encounter for screening for respiratory tuberculosis: Secondary | ICD-10-CM | POA: Diagnosis not present

## 2020-05-26 NOTE — Progress Notes (Signed)
    SUBJECTIVE:   CHIEF COMPLAINT / HPI:   In-person Dari interpretor used. Sponsor Dr. Dimple Casey present for visit.  No specific concerns expressed today.  [facility] with INH 300 mg and B6 25mg . She has to get labs every month for treatment. Her last labs were reportedly normal.  She has been taking the rosuvastatin every day as prescribed.  She reports no adverse reactions with this medication.  Denies myalgias.  Has seen Dr. , ophthalmology and reportedly will be receiving glasses for treatment.   PERTINENT  PMH / PSH: Latent TB (started on INH and B6 3 weeks ago), strabismus  OBJECTIVE:   BP 98/68   Pulse 65   Ht 5\' 4"  (1.626 m)   Wt 163 lb 3.2 oz (74 kg)   SpO2 99%   BMI 28.01 kg/m   General: Overweight middle-aged female, NAD Eyes: esotropic strabismus CV: RRR, no murmurs Pulm: CTAB, no wheezes or rales  ASSESSMENT/PLAN:   TB lung, latent Initiated treatment with INH and pyridoxine through the [facility] about 3 weeks ago per report.  Receiving monitoring labs monthly, most recent labs reported to be normal.  Plan for treatment for 6 months total.  Strabismus Has been seen by ophthalmology Dr. [place] and will be receiving glasses for treatment soon.  Refugee health examination HIV and HCV screening labs unable to be obtained at last visit, will obtain today.  Cerebrovascular disease Consider advanced imaging at follow-up given chronic daily headaches.  Not discussed this visit.  Reportedly had a CVA in 2015 while in [place], unable to verify with records.  Dyslipidemia Last lipid panel 1 month ago with total cholesterol 300 and LDL 216. Started on rosuvastatin 5 mg daily after last visit.  Tolerating medication well, will increase rosuvastatin to 10 mg and recheck LDL today.    HCM - pap smear due, brief counseling provided - colonoscopy due, brief counseling provided - mammogram due,  brief counseling provided Sponsor Dr. 2016 will plan to discuss these health screenings in depth with her at home.  [place], MD [facility]

## 2020-05-26 NOTE — Patient Instructions (Addendum)
It was nice seeing you today!  I have sent the increased dose of Crestor to the pharmacy.  Please arrive at least 15 minutes prior to your scheduled appointments.  Stay well, Littie Deeds, MD [facility] [phone]

## 2020-05-27 ENCOUNTER — Other Ambulatory Visit: Payer: Self-pay

## 2020-05-27 ENCOUNTER — Encounter: Payer: Self-pay | Admitting: Family Medicine

## 2020-05-27 ENCOUNTER — Ambulatory Visit (INDEPENDENT_AMBULATORY_CARE_PROVIDER_SITE_OTHER): Payer: Medicaid Other | Admitting: Family Medicine

## 2020-05-27 VITALS — BP 98/68 | HR 65 | Ht 64.0 in | Wt 163.2 lb

## 2020-05-27 DIAGNOSIS — Z0289 Encounter for other administrative examinations: Secondary | ICD-10-CM | POA: Diagnosis not present

## 2020-05-27 DIAGNOSIS — E785 Hyperlipidemia, unspecified: Secondary | ICD-10-CM | POA: Diagnosis not present

## 2020-05-27 DIAGNOSIS — Z7189 Other specified counseling: Secondary | ICD-10-CM

## 2020-05-27 DIAGNOSIS — I679 Cerebrovascular disease, unspecified: Secondary | ICD-10-CM

## 2020-05-27 DIAGNOSIS — H509 Unspecified strabismus: Secondary | ICD-10-CM | POA: Diagnosis not present

## 2020-05-27 DIAGNOSIS — Z227 Latent tuberculosis: Secondary | ICD-10-CM

## 2020-05-27 MED ORDER — ROSUVASTATIN CALCIUM 10 MG PO TABS: 10.0000 mg | ORAL_TABLET | Freq: Every evening | ORAL | 2 refills | Status: DC

## 2020-05-28 DIAGNOSIS — E785 Hyperlipidemia, unspecified: Secondary | ICD-10-CM | POA: Insufficient documentation

## 2020-05-28 LAB — LDL CHOLESTEROL, DIRECT: LDL Direct: 85 mg/dL (ref 0–99)

## 2020-05-28 LAB — HIV ANTIBODY (ROUTINE TESTING W REFLEX): HIV Screen 4th Generation wRfx: NONREACTIVE

## 2020-05-28 LAB — HCV AB W REFLEX TO QUANT PCR: HCV Ab: <0.1 s/co ratio (ref 0.0–0.9)

## 2020-05-28 LAB — HCV INTERPRETATION

## 2020-05-28 NOTE — Assessment & Plan Note (Signed)
Consider advanced imaging at follow-up given chronic daily headaches.  Not discussed this visit.  Reportedly had a CVA in 2015 while in [place], unable to verify with records.

## 2020-05-28 NOTE — Assessment & Plan Note (Signed)
Last lipid panel 1 month ago with total cholesterol 300 and LDL 216. Started on rosuvastatin 5 mg daily after last visit.  Tolerating medication well, will increase rosuvastatin to 10 mg and recheck LDL today.

## 2020-05-28 NOTE — Assessment & Plan Note (Signed)
HIV and HCV screening labs unable to be obtained at last visit, will obtain today.

## 2020-05-28 NOTE — Assessment & Plan Note (Signed)
Initiated treatment with INH and pyridoxine through the [facility] about 3 weeks ago per report.  Receiving monitoring labs monthly, most recent labs reported to be normal.  Plan for treatment for 6 months total.

## 2020-05-28 NOTE — Assessment & Plan Note (Signed)
Has been seen by ophthalmology Dr. [place] and will be receiving glasses for treatment soon.

## 2020-06-03 DIAGNOSIS — Z111 Encounter for screening for respiratory tuberculosis: Secondary | ICD-10-CM | POA: Diagnosis not present

## 2020-06-03 DIAGNOSIS — R7612 Nonspecific reaction to cell mediated immunity measurement of gamma interferon antigen response without active tuberculosis: Secondary | ICD-10-CM | POA: Diagnosis not present

## 2020-06-07 ENCOUNTER — Ambulatory Visit: Payer: No Typology Code available for payment source | Admitting: Family Medicine

## 2020-06-24 ENCOUNTER — Ambulatory Visit (INDEPENDENT_AMBULATORY_CARE_PROVIDER_SITE_OTHER): Payer: Medicaid Other | Admitting: Clinical

## 2020-06-24 ENCOUNTER — Other Ambulatory Visit: Payer: Self-pay

## 2020-06-24 DIAGNOSIS — Z654 Victim of crime and terrorism: Secondary | ICD-10-CM

## 2020-06-24 DIAGNOSIS — F419 Anxiety disorder, unspecified: Secondary | ICD-10-CM
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
## 2020-06-29 ENCOUNTER — Ambulatory Visit (INDEPENDENT_AMBULATORY_CARE_PROVIDER_SITE_OTHER): Payer: Medicaid Other | Admitting: Family Medicine

## 2020-06-29 ENCOUNTER — Ambulatory Visit (INDEPENDENT_AMBULATORY_CARE_PROVIDER_SITE_OTHER): Payer: Medicaid Other

## 2020-06-29 ENCOUNTER — Encounter: Payer: Self-pay | Admitting: Family Medicine

## 2020-06-29 ENCOUNTER — Other Ambulatory Visit: Payer: Self-pay

## 2020-06-29 VITALS — BP 102/68 | HR 66 | Ht 64.0 in | Wt 156.2 lb

## 2020-06-29 DIAGNOSIS — E785 Hyperlipidemia, unspecified: Secondary | ICD-10-CM

## 2020-06-29 DIAGNOSIS — G44209 Tension-type headache, unspecified, not intractable: Secondary | ICD-10-CM | POA: Diagnosis present

## 2020-06-29 DIAGNOSIS — I679 Cerebrovascular disease, unspecified: Secondary | ICD-10-CM | POA: Diagnosis not present

## 2020-06-29 DIAGNOSIS — Z23 Encounter for immunization: Secondary | ICD-10-CM

## 2020-06-29 MED ORDER — ROSUVASTATIN CALCIUM 5 MG PO TABS: 10.0000 mg | ORAL_TABLET | Freq: Every evening | ORAL | 3 refills | Status: DC

## 2020-06-29 NOTE — Progress Notes (Signed)
    SUBJECTIVE:   CHIEF COMPLAINT / HPI:   In person Dari interpreter used for this visit.  Accompanied by sponsor Dr. Dimple Casey.  Family recently moved into a new house and is going well.  Headaches Patient reports occasional headaches that do not occur daily.  Headache is typically posterior associated with neck pain.  Headaches are typically triggered by stress and people talking at her.  Headaches are typically relieved by ibuprofen.  She will occasionally require a second ibuprofen tablet to resolve the headache.  Denies associated nausea, vomiting, vision changes, focal weakness, numbness, tingling.  Headache does not wake her up from sleep.  Leg cramps Patient reports having leg cramps ever since her rosuvastatin dose was increased to 10 mg.  Previously she was tolerating the 5 mg dose without difficulty.  Cerebrovascular disease Unclear history of CVA in 2015.  Per sponsor Dr. Dimple [place], son had been saying that she had a stroke in 2015 and that she developed strabismus after the stroke.  He had told her that she had a stroke or mini stroke last year in [place], and that her strabismus had worsened after that hospitalization. Upon questioning the patient, patient states that she has had strabismus since childhood.  She is unsure if she has had a stroke. Patient is requesting a neurology referral.  PERTINENT  PMH / PSH: Latent TB ([facility] currently), HLD, strabismus, cerebrovascular disease (?)  OBJECTIVE:   BP 102/68   Pulse 66   Ht 5\' 4"  (1.626 m)   Wt 156 lb 3.2 oz (70.9 kg)   SpO2 98%   BMI 26.81 kg/m   General: Overweight middle-aged woman, NAD Eyes: PERRL, esotropic strabismus CV: RRR, no murmurs Pulm: CTAB, no wheezes or rales MSK: no midline cervical spine or paraspinal muscle tenderness Neuro: Alert, interactive, CN II through XII intact, 5/5 strength in all extremities  ASSESSMENT/PLAN:   Cerebrovascular disease Unclear history of CVA  in 2015.  Unable to obtain records as this was in 2016.  Will place referral to neurology per patient request and defer to them for further imaging.  Dyslipidemia She had significant response to the 5 mg dose of rosuvastatin, LDL dropped to 85.  She has had myalgias with the higher dose of 10 mg rosuvastatin so we will decrease dose back to 5 mg.   Tension headache Headache appears to be most consistent with tension headache based on symptoms.  Headaches appear to be mild and adequately treated with as needed ibuprofen.  Neurologically intact other than esotropic strabismus.  Do not see any indication for imaging at this time.  [place], MD [facility]

## 2020-06-29 NOTE — Patient Instructions (Addendum)
It was nice seeing you today!  Take the lower dose of Crestor 5 mg.  Neurology referral placed.  Please arrive at least 15 minutes prior to your scheduled appointments.  Stay well, Littie Deeds, MD [facility] [phone]

## 2020-06-29 NOTE — Assessment & Plan Note (Signed)
Unclear history of CVA in 2015.  Unable to obtain records as this was in [place].  Will place referral to neurology per patient request and defer to them for further imaging.

## 2020-06-29 NOTE — Assessment & Plan Note (Signed)
She had significant response to the 5 mg dose of rosuvastatin, LDL dropped to 85.  She has had myalgias with the higher dose of 10 mg rosuvastatin so we will decrease dose back to 5 mg.

## 2020-07-04 DIAGNOSIS — Z111 Encounter for screening for respiratory tuberculosis: Secondary | ICD-10-CM | POA: Diagnosis not present

## 2020-07-04 DIAGNOSIS — R7612 Nonspecific reaction to cell mediated immunity measurement of gamma interferon antigen response without active tuberculosis: Secondary | ICD-10-CM | POA: Diagnosis not present

## 2020-07-27 ENCOUNTER — Telehealth: Payer: Self-pay

## 2020-07-27 NOTE — Telephone Encounter (Signed)
SC[employer] forms completed by provider. Joyce Frye has been called per note to inform of forms ready for pickup. A copy was scanned into batch scanning.

## 2020-08-03 DIAGNOSIS — R7612 Nonspecific reaction to cell mediated immunity measurement of gamma interferon antigen response without active tuberculosis: Secondary | ICD-10-CM | POA: Diagnosis not present

## 2020-08-18 NOTE — Progress Notes (Signed)
    SUBJECTIVE:   CHIEF COMPLAINT / HPI: low back pain   Back Pain Onset: 1 month ago; occurred after picking up a vacuum  Location/Radiation: Bilateral lower back; did not hear anything snap or pop but had severe pain and at night it radiates to both feet Duration: throughout the night  Character: like hard ball and spikes in LEs that travels down, pain feels deep in her bones  Aggravating Factors: worse with walking  Alleviating Factors: ibuprofen for a few hours, 1-2 tablets per day, pain is less severe with sitting  Timing: nighttime, when wake in the AM, feet feel swollen and increased pressure and like a ball in different parts of legs Severity: severe Reports history of trauma Denies history of prolonged steroid use, bowel or bladder incontinence, urinary retention, numbness or tingling in extremities or saddle anesthesia, weakness in extremities, fevers, chills, hx cancer, changes in weight, night sweats.  No history of osteoporosis.    OBJECTIVE:   BP 126/80   Pulse 67   Ht 5' 4.17" (1.63 m)   Wt 161 lb 3.2 oz (73.1 kg)   SpO2 99%   BMI 27.52 kg/m   No gross deformity, scoliosis. No overlying skin changes TTP in lumbar-sacral region bilaterally.  No midline or bony TTP. ROM limited 2/2 to pain, patient able to fully flex spine but can not extend or rotate. Strength LEs 5/5 all muscle groups.   + SLRs bilaterally  Sensation intact to light touch bilaterally. - fabers   ASSESSMENT/PLAN:   Lumbar back pain Appears to be acute on chronic issue. No red flag symptoms reported during interview nor found on exam. Suspect may have some component of arthritis or spinal stenosis given positive response to SLR.  - Naproxen prescribed  - refilled voltaren gel prescription, printed  - f/u as scheduled  - handout given with stretches and exercises  - will send for lumbar imaging with XR     Ronnald Ramp, MD [facility]

## 2020-08-19 ENCOUNTER — Ambulatory Visit (INDEPENDENT_AMBULATORY_CARE_PROVIDER_SITE_OTHER): Payer: Medicaid Other | Admitting: Family Medicine

## 2020-08-19 ENCOUNTER — Encounter: Payer: Self-pay | Admitting: Family Medicine

## 2020-08-19 ENCOUNTER — Ambulatory Visit
Admission: RE | Admit: 2020-08-19 | Discharge: 2020-08-19 | Disposition: A | Discharge status: Home / Self Care | Payer: No Typology Code available for payment source | Source: Ambulatory Visit | Attending: Family Medicine | Admitting: Family Medicine

## 2020-08-19 ENCOUNTER — Other Ambulatory Visit: Payer: Self-pay

## 2020-08-19 VITALS — BP 126/80 | HR 67 | Ht 64.17 in | Wt 161.2 lb

## 2020-08-19 DIAGNOSIS — M545 Low back pain, unspecified: Secondary | ICD-10-CM

## 2020-08-19 DIAGNOSIS — S46812A Strain of other muscles, fascia and tendons at shoulder and upper arm level, left arm, initial encounter: Secondary | ICD-10-CM

## 2020-08-19 MED ORDER — DICLOFENAC SODIUM 1 % EX GEL
CUTANEOUS | 3 refills | Status: DC
Start: 2020-08-19 — End: 2023-07-29

## 2020-08-19 MED ORDER — NAPROXEN 500 MG PO TBEC: 500.0000 mg | DELAYED_RELEASE_TABLET | Freq: Two times a day (BID) | ORAL | 0 refills | Status: DC

## 2020-08-19 NOTE — Patient Instructions (Addendum)
It was a pleasure to see you today!  Thank you for choosing [facility] for your primary care.   Joyce Frye was seen for back pain.   Our plans for today were:  I have prescribed Naproxen to help with your back pain.   We will also order an xray of of your lower spine.  I will follow up with results once they are available.     You should return to our clinic on 6/17 at 3:15 PM for back pain follow up.   Best Wishes,   Dr. Neita Garnet

## 2020-08-21 DIAGNOSIS — M545 Low back pain, unspecified: Secondary | ICD-10-CM | POA: Insufficient documentation

## 2020-08-21 NOTE — Assessment & Plan Note (Signed)
Appears to be acute on chronic issue. No red flag symptoms reported during interview nor found on exam. Suspect may have some component of arthritis or spinal stenosis given positive response to SLR.  - Naproxen prescribed  - refilled voltaren gel prescription, printed  - f/u as scheduled  - handout given with stretches and exercises  - will send for lumbar imaging with XR

## 2020-08-24 ENCOUNTER — Other Ambulatory Visit: Payer: Self-pay | Admitting: Family Medicine

## 2020-08-31 DIAGNOSIS — R7612 Nonspecific reaction to cell mediated immunity measurement of gamma interferon antigen response without active tuberculosis: Secondary | ICD-10-CM | POA: Diagnosis not present

## 2020-08-31 DIAGNOSIS — Z111 Encounter for screening for respiratory tuberculosis: Secondary | ICD-10-CM | POA: Diagnosis not present

## 2020-09-02 ENCOUNTER — Ambulatory Visit: Payer: No Typology Code available for payment source | Admitting: Family Medicine

## 2020-09-02 ENCOUNTER — Other Ambulatory Visit: Payer: Self-pay | Admitting: Family Medicine

## 2020-09-06 ENCOUNTER — Ambulatory Visit: Payer: Medicaid Other | Admitting: Family Medicine

## 2020-09-06 ENCOUNTER — Other Ambulatory Visit: Payer: Self-pay

## 2020-09-06 ENCOUNTER — Encounter: Payer: Self-pay | Admitting: Family Medicine

## 2020-09-06 VITALS — BP 105/71 | HR 67 | Ht 64.0 in | Wt 158.2 lb

## 2020-09-06 DIAGNOSIS — M545 Low back pain, unspecified: Secondary | ICD-10-CM | POA: Diagnosis not present

## 2020-09-06 MED ORDER — CYCLOBENZAPRINE HCL 5 MG PO TABS: 5.0000 mg | ORAL_TABLET | Freq: Every day | ORAL | 0 refills | Status: DC

## 2020-09-06 NOTE — Assessment & Plan Note (Signed)
Patient experiencing MSK pain in her bilateral shoulders, both sides of her back and paravertebral spinal musculature and going down both of her legs.  The pain has been going on now for 1-1/2 weeks or so.  Patient is very stressed.  Palpation reveals many trigger points and tender points, patient is grossly tender to palpation.  No other neurological abnormality appreciated on physical exam.  Believe patient's pain is most likely due to muscle spasms.  Patient did not have any relief with the Naprosyn from her previous visit. -Patient prescribed Flexeril 5 mg daily at bedtime -For now patient can continue her mirtazapine as I believe her decreased sleep is contributing to her symptoms.  Please look at patient's medications and make decision to continue/discontinue mirtazapine based on how patient responds to Flexeril in an effort to avoid polypharmacy -There is a possibility patient's Crestor could potentially be causing her pain; however, patient was started on Crestor in April 2022 and patient did not start having her pain until the last couple of weeks or so.  Consider decreasing dose from 10 mg daily down to 5 mg daily. -Consider physical therapy referral versus MRI to further evaluate low back pain; however, pain is more universal and less likely sciatica

## 2020-09-06 NOTE — Progress Notes (Signed)
SUBJECTIVE:   CHIEF COMPLAINT / HPI:   Lumbar back pain, follow-up: Pleasant, Dari-speaking patient presents to clinic for follow-up of back pain.  Patient previously seen 08/19/2020 for low back pain.  At the time was sent for low back x-ray which revealed normal anatomy, no acute osseous injury, no listhesis; however, did appreciate mild straightening of lumbar lordosis.  Patient symptoms appear to be acute on chronic issue without red flag symptoms during interview or on physical exam.  At the time arthritis versus spinal stenosis was suspected.  Patient prescribed naproxen p.o and Voltaren gel topical was represcribed.  Was asked to follow-up in 2 weeks.  Patient does have history of latent TB for which she is currently being treated, with normal x-ray doubt [place].  Today patient reports she continues to have pain, and the pain goes from her shoulders down her back and into her bilateral legs.  She says the pain feels sharp in nature.  She also says that the last time she was here the pain was going from her back into her legs.  She says the pain goes into both of her toes bilaterally and the pain feels the same in both legs.  Any sort of activity/movement makes the pain worse.  She has not been able to find anything that makes the pain better other than staying still.  Pain is worse in the mornings and in the evenings, but manages to lessen slightly during the day.  She denies any concerning symptoms such as urinary or bowel incontinence.  Denies any recent falls, traumas.  Health maintenance: Patient is due for Pap smear, colonoscopy, mammogram, and second shingles vaccine.  PERTINENT  PMH / PSH:  Patient Active Problem List   Diagnosis Date Noted   Lumbar back pain 08/21/2020   Dyslipidemia 05/28/2020   Refugee health examination 04/19/2020   Strabismus 04/19/2020   Cerebrovascular disease 04/19/2020   TB lung, latent 04/19/2020     OBJECTIVE:   BP 105/71   Pulse 67   Ht 5\' 4"   (1.626 m)   Wt 158 lb 4 oz (71.8 kg)   SpO2 98%   BMI 27.16 kg/m    Physical exam: General: Nontoxic-appearing Respiratory: comfortable work of breathing on room air BACK:  - No gross deformity, scoliosis. -Palpation: Tenderness to palpation occurs grossly over the back especially to muscular surfaces; palpation also reveals multiple trigger points and tender points.   -ROM: Full  -Strength: LEs 5/5 all muscle groups.   -Reflexes: 1+ MSR's and patellar reflexes, equal bilaterally   Negative logroll bilateral hips Negative fabers and piriformis stretches.   ASSESSMENT/PLAN:   Lumbar back pain Patient experiencing MSK pain in her bilateral shoulders, both sides of her back and paravertebral spinal musculature and going down both of her legs.  The pain has been going on now for 1-1/2 weeks or so.  Patient is very stressed.  Palpation reveals many trigger points and tender points, patient is grossly tender to palpation.  No other neurological abnormality appreciated on physical exam.  Believe patient's pain is most likely due to muscle spasms.  Patient did not have any relief with the Naprosyn from her previous visit. -Patient prescribed Flexeril 5 mg daily at bedtime -For now patient can continue her mirtazapine as I believe her decreased sleep is contributing to her symptoms.  Please look at patient's medications and make decision to continue/discontinue mirtazapine based on how patient responds to Flexeril in an effort to avoid polypharmacy -There is  a possibility patient's Crestor could potentially be causing her pain; however, patient was started on Crestor in April 2022 and patient did not start having her pain until the last couple of weeks or so.  Consider decreasing dose from 10 mg daily down to 5 mg daily. -Consider physical therapy referral versus MRI to further evaluate low back pain; however, pain is more universal and less likely sciatica     Dollene Cleveland, DO [facility]  [facility]

## 2020-09-06 NOTE — Patient Instructions (Signed)
Thank you for coming in to see [place] today! Please see below to review our plan for today's visit:  My concern is for muscle spasms causing full body pain.   1. Take 1 tablet of Flexeril 5mg  about 1 hour before bed time to help the muscles relax, to decrease pain, and to help with sleep.  2. Continue the Mirtazapine 7.5 mg tablet at night. STOP NAPROSYN.   Please call the clinic at [phone] if your symptoms worsen or you have any concerns. It was our pleasure to serve you!   Dr. [phone] [facility]

## 2020-09-09 ENCOUNTER — Other Ambulatory Visit: Payer: Self-pay | Admitting: Family Medicine

## 2020-09-29 ENCOUNTER — Encounter: Payer: Self-pay | Admitting: Neurology

## 2020-09-29 ENCOUNTER — Ambulatory Visit: Payer: Medicaid Other | Admitting: Neurology

## 2020-09-29 VITALS — BP 101/68 | HR 78 | Ht 63.0 in | Wt 162.5 lb

## 2020-09-29 DIAGNOSIS — F32A Depression, unspecified: Secondary | ICD-10-CM | POA: Diagnosis not present

## 2020-09-29 DIAGNOSIS — H539 Unspecified visual disturbance: Secondary | ICD-10-CM | POA: Diagnosis not present

## 2020-09-29 DIAGNOSIS — R202 Paresthesia of skin: Secondary | ICD-10-CM | POA: Diagnosis not present

## 2020-09-29 MED ORDER — DULOXETINE HCL 60 MG PO CPEP: 60.0000 mg | ORAL_CAPSULE | Freq: Every day | ORAL | 6 refills | Status: DC

## 2020-09-29 NOTE — Progress Notes (Signed)
Chief Complaint  Patient presents with   New Patient (Initial Visit)    Being seen for stroke history NR, sponsor Darel Hong, son Samir & Interpreter Ahmed in room      ASSESSMENT AND PLAN  Joyce Frye is a 50 y.o. female   Right side paresthesia  Possible TIA  MRI of the brain to rule out structural abnormality Diffuse body achy pain, depression  Add on Cymbalta 60 mg daily  DIAGNOSTIC DATA (LABS, IMAGING, TESTING) - I reviewed patient records, labs, notes, testing and imaging myself where available. Laboratory evaluation in 2022: LDL 85, negative hepatitis C, HIV, TSH, lipid panel, LDL 216, cholesterol 300, hepatitis B surface antibody was 6.6, negative hepatitis B core antibody, negative hepatitis B surface antigen,  Varicella-zoster IgG was 1974, normal CMP, creatinine of 0.69, CBC showed hemoglobin of 13.5, RPR negative, A1c 5.5,  MEDICAL HISTORY:  Anaia Frye is a 50 year old female, seen in request by her primary care physician Dr. Wynelle Link, Gerlene Burdock, accompanied by her sponsor Darel Hong and her son Jordan Likes, interpretor at today's visit on September 29 2020.  She immigrated from [place] to [place] in August 2021   I reviewed and summarized the referring note.PMHX. HLD Depression, Remeron 7.5mg  qhs,  Test positive for TB,  is receiving isoniazid and vitamin B6 treatment  Patient reported 1 possible TIA in 2015, it happened in extreme stress, she had difficulty moving her body, also had right side numbness involving her right face, arm, neck, generalized weakness, she was admitted to hospital at [place], had extensive evaluation, symptoms improved in about a month, while her stress was relieved  In October 2021, soon after she was forced to leave [place], immigrated to [place], she was under a lot of stress, crying a lot, presented with sudden onset difficulty talking, recurrent numbness on the right side of her body, difficulty moving, lasting for 2 days  She now lives at  home with her family, continued complaints of constellation of symptoms, worsening depression, lack of passion, motivation, also noted worsening strabismus, poor vision, which has much improved with a new pair of glasses.    PHYSICAL EXAM:   Vitals:   09/29/20 1506  BP: 101/68  Pulse: 78  Weight: 162 lb 8 oz (73.7 kg)  Height: 5\' 3"  (1.6 m)   Not recorded     Body mass index is 28.79 kg/m.  PHYSICAL EXAMNIATION:  Gen: NAD, conversant, well nourised, well groomed                     Cardiovascular: Regular rate rhythm, no peripheral edema, warm, nontender. Eyes: Conjunctivae clear without exudates or hemorrhage Neck: Supple, no carotid bruits. Pulmonary: Clear to auscultation bilaterally   NEUROLOGICAL EXAM:  MENTAL STATUS: Speech:    Speech is normal; fluent and spontaneous with normal comprehension.  Cognition:     Orientation to time, place and person     Normal recent and remote memory     Normal Attention span and concentration     Normal Language, naming, repeating,spontaneous speech     Fund of knowledge   CRANIAL NERVES: CN II: Limited examination due to language barrier, pupil round reactive to light, OU 20/200 CN III, IV, VI: extraocular movement are normal. No ptosis.  Cover and uncover testing demonstrates severe right esotropia CN V: Facial sensation is intact to light touch CN VII: Face is symmetric with normal eye closure  CN VIII: Hearing is normal to causal conversation. CN IX, X: Phonation is  normal. CN XI: Head turning and shoulder shrug are intact  MOTOR: There is no pronator drift of out-stretched arms. Muscle bulk and tone are normal. Muscle strength is normal.  REFLEXES: Reflexes are 2+ and symmetric at the biceps, triceps, knees, and ankles. Plantar responses are flexor.  SENSORY: Intact to light touch, pinprick and vibratory sensation are intact in fingers and toes.  COORDINATION: There is no trunk or limb dysmetria  noted.  GAIT/STANCE: Posture is normal. Gait is steady with normal steps, base, arm swing, and turning. Heel and toe walking are normal. Tandem gait is normal.  Romberg is absent.  REVIEW OF SYSTEMS:  Full 14 system review of systems performed and notable only for as above All other review of systems were negative.   ALLERGIES: Allergies  Allergen Reactions   Paracetamol [Acetaminophen]     Tremors     HOME MEDICATIONS: Current Outpatient Medications  Medication Sig Dispense Refill   cyclobenzaprine (FLEXERIL) 5 MG tablet Take 1 tablet (5 mg total) by mouth at bedtime. 30 tablet 0   diclofenac Sodium (VOLTAREN) 1 % GEL Apply four times daily to affected area 100 g 3   isoniazid (NYDRAZID) 300 MG tablet Take 300 mg by mouth daily.     mirtazapine (REMERON) 7.5 MG tablet Take 1 tablet (7.5 mg total) by mouth at bedtime. 90 tablet 3   pyridOXINE (VITAMIN B-6) 25 MG tablet Take 25 mg by mouth daily.     rosuvastatin (CRESTOR) 5 MG tablet Take 2 tablets (10 mg total) by mouth at bedtime. 90 tablet 3   No current facility-administered medications for this visit.    PAST MEDICAL HISTORY: Past Medical History:  Diagnosis Date   Cerebrovascular disease    Chronic headache    Strabismus    Stroke (HCC)    TB lung, latent     PAST SURGICAL HISTORY: Past Surgical History:  Procedure Laterality Date   CHOLECYSTECTOMY      FAMILY HISTORY: History reviewed. No pertinent family history.  SOCIAL HISTORY: Social History   Socioeconomic History   Marital status: Married    Spouse name: Not on file   Number of children: Not on file   Years of education: Not on file   Highest education level: Not on file  Occupational History   Not on file  Tobacco Use   Smoking status: Never   Smokeless tobacco: Never  Substance and Sexual Activity   Alcohol use: Never   Drug use: Never   Sexual activity: Not Currently  Other Topics Concern   Not on file  Social History Narrative    Needs interpreter   Lives with 2 daughters and son   Right Handed   Drinks no caffeine      Son is Corporate treasurer    Social Determinants of Health   Financial Resource Strain: Not on file  Food Insecurity: Not on file  Transportation Needs: Not on file  Physical Activity: Not on file  Stress: Not on file  Social Connections: Not on file  Intimate Partner Violence: Not on file      Levert Feinstein, M.D. Ph.D.  [facility] [address] [place], [place] [address] Ph: [phone] Fax: [phone]  CC:  Littie Deeds, MD [address] [place],  [place] [address]  Littie Deeds, MD

## 2020-10-01 ENCOUNTER — Emergency Department (HOSPITAL_COMMUNITY): Payer: Medicaid Other

## 2020-10-01 ENCOUNTER — Encounter (HOSPITAL_COMMUNITY): Payer: Self-pay | Admitting: Emergency Medicine

## 2020-10-01 ENCOUNTER — Emergency Department (HOSPITAL_COMMUNITY)
Admission: EM | Admit: 2020-10-01 | Discharge: 2020-10-01 | Disposition: A | Discharge status: Home / Self Care | Payer: Medicaid Other | Attending: Emergency Medicine | Admitting: Emergency Medicine

## 2020-10-01 ENCOUNTER — Other Ambulatory Visit: Payer: Self-pay

## 2020-10-01 DIAGNOSIS — Z20822 Contact with and (suspected) exposure to covid-19: Secondary | ICD-10-CM | POA: Insufficient documentation

## 2020-10-01 DIAGNOSIS — R0602 Shortness of breath: Secondary | ICD-10-CM | POA: Diagnosis not present

## 2020-10-01 DIAGNOSIS — Z79899 Other long term (current) drug therapy: Secondary | ICD-10-CM | POA: Diagnosis not present

## 2020-10-01 DIAGNOSIS — R531 Weakness: Secondary | ICD-10-CM | POA: Diagnosis not present

## 2020-10-01 DIAGNOSIS — J9811 Atelectasis: Secondary | ICD-10-CM | POA: Diagnosis not present

## 2020-10-01 LAB — HEPATIC FUNCTION PANEL
ALT: 11 U/L (ref 0–44)
AST: 37 U/L (ref 15–41)
Albumin: 4.2 g/dL (ref 3.5–5.0)
Alkaline Phosphatase: 74 U/L (ref 38–126)
Bilirubin, Direct: 0.2 mg/dL (ref 0.0–0.2)
Indirect Bilirubin: 0.9 mg/dL (ref 0.3–0.9)
Total Bilirubin: 1.1 mg/dL (ref 0.3–1.2)
Total Protein: 8.3 g/dL — ABNORMAL HIGH (ref 6.5–8.1)

## 2020-10-01 LAB — CBC WITH DIFFERENTIAL/PLATELET
Abs Immature Granulocytes: 0.02 10*3/uL (ref 0.00–0.07)
Basophils Absolute: 0 10*3/uL (ref 0.0–0.1)
Basophils Relative: 0 %
Eosinophils Absolute: 0 10*3/uL (ref 0.0–0.5)
Eosinophils Relative: 0 %
HCT: 39.7 % (ref 36.0–46.0)
Hemoglobin: 13.1 g/dL (ref 12.0–15.0)
Immature Granulocytes: 0 %
Lymphocytes Relative: 36 %
Lymphs Abs: 2.3 10*3/uL (ref 0.7–4.0)
MCH: 29 pg (ref 26.0–34.0)
MCHC: 33 g/dL (ref 30.0–36.0)
MCV: 87.8 fL (ref 80.0–100.0)
Monocytes Absolute: 0.3 10*3/uL (ref 0.1–1.0)
Monocytes Relative: 4 %
Neutro Abs: 3.8 10*3/uL (ref 1.7–7.7)
Neutrophils Relative %: 60 %
Platelets: 257 10*3/uL (ref 150–400)
RBC: 4.52 MIL/uL (ref 3.87–5.11)
RDW: 12.2 % (ref 11.5–15.5)
WBC: 6.5 10*3/uL (ref 4.0–10.5)
nRBC: 0 % (ref 0.0–0.2)

## 2020-10-01 LAB — BASIC METABOLIC PANEL
Anion gap: 8 (ref 5–15)
BUN: 14 mg/dL (ref 6–20)
CO2: 24 mmol/L (ref 22–32)
Calcium: 9.5 mg/dL (ref 8.9–10.3)
Chloride: 107 mmol/L (ref 98–111)
Creatinine, Ser: 0.59 mg/dL (ref 0.44–1.00)
GFR, Estimated: >60 mL/min (ref >60)
Glucose, Bld: 158 mg/dL — ABNORMAL HIGH (ref 70–99)
Potassium: 3.5 mmol/L (ref 3.5–5.1)
Sodium: 139 mmol/L (ref 135–145)

## 2020-10-01 LAB — RESP PANEL BY RT-PCR (FLU A&B, COVID) ARPGX2
Influenza A by PCR: NEGATIVE
Influenza B by PCR: NEGATIVE
SARS Coronavirus 2 by RT PCR: NEGATIVE

## 2020-10-01 MED ORDER — METHYLPREDNISOLONE SODIUM SUCC 125 MG IJ SOLR
125.0000 mg | Freq: Once | INTRAMUSCULAR | Status: AC
  Administered 2020-10-01: 125 mg via INTRAVENOUS
  Filled 2020-10-01: qty 2

## 2020-10-01 MED ORDER — DIPHENHYDRAMINE HCL 50 MG/ML IJ SOLN
25.0000 mg | Freq: Once | INTRAMUSCULAR | Status: AC
  Administered 2020-10-01: 25 mg via INTRAVENOUS
  Filled 2020-10-01: qty 1

## 2020-10-01 NOTE — ED Notes (Signed)
Vital signs stable. 

## 2020-10-01 NOTE — ED Notes (Signed)
ED Provider at bedside. 

## 2020-10-01 NOTE — ED Triage Notes (Signed)
Patient reports SOB yesterday with generalized weakness/fatigue , denies chest pain , no cough or fever , patient suspects side effects of her medications .

## 2020-10-01 NOTE — Discharge Instructions (Addendum)
Possible medication reaction.  Recommend holding any medication follow-up with your primary care provider for further discussion. Use incentive spirometer once every hour.  Return to the emergency room for worsening or concerning symptoms.

## 2020-10-01 NOTE — ED Provider Notes (Signed)
[facility] EMERGENCY DEPARTMENT Provider Note   CSN: [id number] Arrival date & time: 10/01/20  5361     History Chief Complaint  Patient presents with   Shortness of Breath    Joyce Frye is a 50 y.o. female.  50 year old female brought in by son from home with complaint of difficulty breathing and generalized weakness.  Patient's son provides history, declines interpreter.  States that patient has been sad due to loss of family in [place], went to PCP yesterday and was prescribed Cymbalta.  Patient took 1 pill yesterday and a few hours later began to feel generally weak and reported difficulty breathing.  Son states that patient walked into his room today and woke him up and then dropped down to her knees.  Son was able to help with patient to the car and into the emergency room.  No loss of consciousness, denies unilateral weakness or numbness.  Points to her throat and indicates difficulty breathing.  Level 5 caveat applies as patient answers limited questions as translated by her son.  Family sponsor at bedside, reports patient has been seen by neurology locally due to report of history of stroke, no report of deficits, plan is for MRI at a later date to further evaluate.      Past Medical History:  Diagnosis Date   Cerebrovascular disease    Chronic headache    Strabismus    Stroke [facility])    TB lung, latent     Patient Active Problem List   Diagnosis Date Noted   Paresthesia 09/29/2020   Depression 09/29/2020   Abnormal vision 09/29/2020   Lumbar back pain 08/21/2020   Dyslipidemia 05/28/2020   Refugee health examination 04/19/2020   Strabismus 04/19/2020   Cerebrovascular disease 04/19/2020   TB lung, latent 04/19/2020    Past Surgical History:  Procedure Laterality Date   CHOLECYSTECTOMY       OB History   No obstetric history on file.     No family history on file.  Social History   Tobacco Use   Smoking status: Never    Smokeless tobacco: Never  Substance Use Topics   Alcohol use: Never   Drug use: Never    Home Medications Prior to Admission medications   Medication Sig Start Date End Date Taking? Authorizing Provider  cyclobenzaprine (FLEXERIL) 5 MG tablet Take 1 tablet (5 mg total) by mouth at bedtime. 09/06/20   Dollene Cleveland, DO  diclofenac Sodium (VOLTAREN) 1 % GEL Apply four times daily to affected area 08/19/20   Simmons-Robinson, Makiera, MD  DULoxetine (CYMBALTA) 60 MG capsule Take 1 capsule (60 mg total) by mouth daily. 09/29/20   Levert Feinstein, MD  isoniazid (NYDRAZID) 300 MG tablet Take 300 mg by mouth daily.    [provider]  mirtazapine (REMERON) 7.5 MG tablet Take 1 tablet (7.5 mg total) by mouth at bedtime. 04/19/20   Westley Chandler, MD  pyridOXINE (VITAMIN B-6) 25 MG tablet Take 25 mg by mouth daily.    [provider]  rosuvastatin (CRESTOR) 5 MG tablet Take 2 tablets (10 mg total) by mouth at bedtime. 06/29/20   Littie Deeds, MD    Allergies    Paracetamol [acetaminophen]  Review of Systems   Review of Systems  Constitutional:  Negative for fever.  HENT:  Positive for trouble swallowing.   Respiratory:  Positive for shortness of breath. Negative for cough.   Cardiovascular:  Negative for chest pain.  Gastrointestinal:  Negative for  abdominal pain, constipation, diarrhea, nausea and vomiting.  Musculoskeletal:  Negative for arthralgias and myalgias.  Skin:  Negative for rash and wound.  Allergic/Immunologic: Negative for immunocompromised state.  Neurological:  Positive for weakness. Negative for numbness.  Psychiatric/Behavioral:  Negative for confusion.    Physical Exam Updated Vital Signs BP 120/81   Pulse 65   Temp 98.4 F (36.9 C) (Oral)   Resp 11   Ht 5\' 3"  (1.6 m)   Wt 73.7 kg   SpO2 99%   BMI 28.79 kg/m   Physical Exam Vitals and nursing note reviewed.  Constitutional:      Comments: Awake, resting with eyes closed, follows commands  HENT:      Head: Normocephalic and atraumatic.     Mouth/Throat:     Mouth: Mucous membranes are moist.     Pharynx: Oropharynx is clear.  Eyes:     Comments: Inward deviation of right eye (baseline)  Cardiovascular:     Rate and Rhythm: Normal rate and regular rhythm.     Heart sounds: No murmur heard. Pulmonary:     Effort: Pulmonary effort is normal.     Breath sounds: Normal breath sounds. No decreased breath sounds.  Chest:     Chest wall: No tenderness.  Abdominal:     Palpations: Abdomen is soft.     Tenderness: There is no abdominal tenderness.  Musculoskeletal:     Cervical back: Neck supple.     Right lower leg: No tenderness. No edema.     Left lower leg: No tenderness. No edema.  Skin:    General: Skin is warm and dry.     Findings: No erythema or rash.  Neurological:     Mental Status: She is alert.     Motor: No weakness.  Psychiatric:        Behavior: Behavior normal.    ED Results / Procedures / Treatments   Labs (all labs ordered are listed, but only abnormal results are displayed) Labs Reviewed  BASIC METABOLIC PANEL - Abnormal; Notable for the following components:      Result Value   Glucose, Bld 158 (*)    All other components within normal limits  HEPATIC FUNCTION PANEL - Abnormal; Notable for the following components:   Total Protein 8.3 (*)    All other components within normal limits  RESP PANEL BY RT-PCR (FLU A&B, COVID) ARPGX2  CBC WITH DIFFERENTIAL/PLATELET    EKG EKG Interpretation  Date/Time:  Saturday October 01 2020 07:17:29 EDT Ventricular Rate:  83 PR Interval:  156 QRS Duration: 86 QT Interval:  404 QTC Calculation: 474 R Axis:   84 Text Interpretation: Normal sinus rhythm Normal ECG No old tracing to compare Confirmed by [id number] [phone]) on 10/01/2020 9:04:11 AM  Radiology DG Chest 2 View  Result Date: 10/01/2020 CLINICAL DATA:  Short of breath EXAM: CHEST - 2 VIEW COMPARISON:  04/07/2020 FINDINGS: Stable cardiac  silhouette. Mild basilar atelectasis increased from prior. No effusion, infiltrate pneumothorax. No acute osseous abnormality. IMPRESSION: Mild basilar atelectasis. Electronically Signed   By: 04/09/2020 M.D.   On: 10/01/2020 08:40    Procedures Procedures   Medications Ordered in ED Medications  diphenhydrAMINE (BENADRYL) injection 25 mg (25 mg Intravenous Given 10/01/20 1022)  methylPREDNISolone sodium succinate (SOLU-MEDROL) 125 mg/2 mL injection 125 mg (125 mg Intravenous Given 10/01/20 1022)    ED Course  I have reviewed the triage vital signs and the nursing notes.  Pertinent labs & imaging results  that were available during my care of the patient were reviewed by me and considered in my medical decision making (see chart for details).  Clinical Course as of 10/01/20 1046  Sat Oct 01, 2020  6099 50 year old female with complaint of shortness of breath and generalized weakness onset after taking new medication dose x1 yesterday.  On on exam, patient is lying in bed with her eyes closed, does speak with her son and follow commands, does not appear to be in distress.  Vitals reviewed and reassuring, she is mildly tachypneic however this has resolved and her room air O2 sat is 99%. Chest x-ray shows mild atelectasis, given incentive spirometer for this.  CBC and CMP without significant findings, she had COVID and flu negative.  Patient was given Solu-Medrol and Benadryl, reports improvement in her symptoms.  Plan is to discharge to follow-up with PCP, advised to discontinue medication in the meantime.  [LM]    Clinical Course User Index [LM] Alden Hipp   MDM Rules/Calculators/A&P                          Final Clinical Impression(s) / ED Diagnoses Final diagnoses:  SOB (shortness of breath)    Rx / DC Orders ED Discharge Orders     None        Jeannie Fend, PA-C 10/01/20 1046    Terrilee Files, MD 10/01/20 1757

## 2020-10-01 NOTE — ED Notes (Signed)
Family updated as to patient's status.

## 2020-10-03 ENCOUNTER — Telehealth: Payer: Self-pay

## 2020-10-03 NOTE — Telephone Encounter (Signed)
Transition Care Management Follow-up Telephone Call Date of discharge and from where: 10/01/2020-[facility] ED  How have you been since you were released from the hospital? Feeling better  Any questions or concerns? No  Items Reviewed: Did the pt receive and understand the discharge instructions provided? Yes  Medications obtained and verified?  No medication given at discharge  Other? No  Any new allergies since your discharge? No  Dietary orders reviewed? N/A Do you have support at home? Yes   Home Care and Equipment/Supplies: Were home health services ordered? not applicable If so, what is the name of the agency? N/A  Has the agency set up a time to come to the patient's home? not applicable Were any new equipment or medical supplies ordered?  No What is the name of the medical supply agency? N/A Were you able to get the supplies/equipment? not applicable Do you have any questions related to the use of the equipment or supplies? No  Functional Questionnaire: (I = Independent and D = Dependent) ADLs: I  Bathing/Dressing- I  Meal Prep- I  Eating- I  Maintaining continence- I  Transferring/Ambulation- I  Managing Meds- I  Follow up appointments reviewed:  PCP Hospital f/u appt confirmed? No  Specialist Hospital f/u appt confirmed? No   Are transportation arrangements needed? No  If their condition worsens, is the pt aware to call PCP or go to the Emergency Dept.? Yes Was the patient provided with contact information for the PCP's office or ED? Yes Was to pt encouraged to call back with questions or concerns? Yes  Patient son translated.

## 2020-10-06 ENCOUNTER — Telehealth: Payer: Self-pay | Admitting: Neurology

## 2020-10-06 DIAGNOSIS — R7612 Nonspecific reaction to cell mediated immunity measurement of gamma interferon antigen response without active tuberculosis: Secondary | ICD-10-CM | POA: Diagnosis not present

## 2020-10-06 DIAGNOSIS — Z111 Encounter for screening for respiratory tuberculosis: Secondary | ICD-10-CM | POA: Diagnosis not present

## 2020-10-06 NOTE — Telephone Encounter (Signed)
Pt's sponsor Eula Fried on [place] called wanting to inform Dr. Terrace Arabia that pt had a reaction to the DULoxetine (CYMBALTA) 60 MG capsule. Pt did go to the ED and has stopped taking the medication. Any questions please reach out to Martin at [phone].

## 2020-10-24 ENCOUNTER — Other Ambulatory Visit: Payer: Self-pay

## 2020-10-24 ENCOUNTER — Ambulatory Visit
Admission: RE | Admit: 2020-10-24 | Discharge: 2020-10-24 | Disposition: A | Discharge status: Home / Self Care | Payer: Medicaid Other | Source: Ambulatory Visit | Attending: Neurology | Admitting: Neurology

## 2020-10-24 DIAGNOSIS — F32A Depression, unspecified: Secondary | ICD-10-CM | POA: Diagnosis not present

## 2020-10-24 DIAGNOSIS — R202 Paresthesia of skin: Secondary | ICD-10-CM

## 2020-10-26 ENCOUNTER — Telehealth: Payer: Self-pay | Admitting: Neurology

## 2020-10-26 NOTE — Telephone Encounter (Signed)
Left patient a detailed message, with results, on voicemail of son and sponsor (ok per DPR).  Provided our number to call back with any questions.

## 2020-10-26 NOTE — Telephone Encounter (Signed)
Joyce Frye returned a call. Please call back.

## 2020-10-26 NOTE — Telephone Encounter (Signed)
I returned the call to Darel Hong (sponsor on [place]). She verbalized understanding of the MRI findings.

## 2020-10-26 NOTE — Telephone Encounter (Signed)
Please call patient, MRI of the brain showed mild age-related changes, there was no acute abnormality.   IMPRESSION: This MRI of the brain without contrast shows the following: 1.  Couple punctate T2/FLAIR hyperintense foci in the hemispheres consistent with very minimal chronic microvascular ischemic change, normal for age. 2.  Left chronic maxillary sinusitis.   3.  No acute findings.

## 2020-12-17 NOTE — Progress Notes (Deleted)
    SUBJECTIVE:   CHIEF COMPLAINT / HPI: Leg pain, hair loss  Patient has been previously seen for low back pain in clinic.  She has had x-ray of the lumbar spine without any abnormal findings other than straightening of lumbar lordosis.  She had reported pain in her shoulders, back and bilateral legs at last visit 3 months ago.  She was started on cyclobenzaprine 5 mg.  In the interim, she did see neurology Dr. Terrace Arabia and ultimately had an MRI brain which was unremarkable other than minimal chronic changes. She had been started on duloxetine 60 mg but had an adverse reaction (general weakness, difficulty breathing) and went to the ED so has stopped the medication.  PERTINENT  PMH / PSH: Latent TB, depression, hyperlipidemia, strabismus  OBJECTIVE:   There were no vitals taken for this visit.  General: ***, NAD CV: RRR, no murmurs*** Pulm: CTAB, no wheezes or rales  ASSESSMENT/PLAN:   No problem-specific Assessment & Plan notes found for this encounter.     Littie Deeds, MD [facility]   {    This will disappear when note is signed, click to select method of visit    :1}

## 2020-12-22 ENCOUNTER — Ambulatory Visit: Payer: Medicaid Other | Admitting: Family Medicine

## 2021-01-06 ENCOUNTER — Encounter: Payer: Self-pay | Admitting: Family Medicine

## 2021-01-06 ENCOUNTER — Ambulatory Visit: Payer: Medicaid Other | Admitting: Family Medicine

## 2021-01-06 ENCOUNTER — Other Ambulatory Visit: Payer: Self-pay

## 2021-01-06 VITALS — BP 109/73 | HR 66 | Ht 63.0 in | Wt 164.0 lb

## 2021-01-06 DIAGNOSIS — M545 Low back pain, unspecified: Secondary | ICD-10-CM | POA: Diagnosis not present

## 2021-01-06 DIAGNOSIS — E785 Hyperlipidemia, unspecified: Secondary | ICD-10-CM

## 2021-01-06 DIAGNOSIS — F4320 Adjustment disorder, unspecified: Secondary | ICD-10-CM | POA: Diagnosis not present

## 2021-01-06 DIAGNOSIS — F32A Depression, unspecified: Secondary | ICD-10-CM

## 2021-01-06 DIAGNOSIS — G4700 Insomnia, unspecified: Secondary | ICD-10-CM | POA: Diagnosis not present

## 2021-01-06 DIAGNOSIS — L650 Telogen effluvium: Secondary | ICD-10-CM

## 2021-01-06 MED ORDER — MIRTAZAPINE 7.5 MG PO TABS: 7.5000 mg | ORAL_TABLET | Freq: Every day | ORAL | 3 refills | Status: DC

## 2021-01-06 MED ORDER — MIRTAZAPINE 15 MG PO TABS: 15.0000 mg | ORAL_TABLET | Freq: Every day | ORAL | 1 refills | Status: DC

## 2021-01-06 MED ORDER — ROSUVASTATIN CALCIUM 5 MG PO TABS: 10.0000 mg | ORAL_TABLET | Freq: Every evening | ORAL | 3 refills | Status: DC

## 2021-01-06 NOTE — Assessment & Plan Note (Signed)
Reassurance provided that visit rosuvastatin will have no adverse effects.  Will refill at next visit and will plan to recheck lipid panel then.

## 2021-01-06 NOTE — Assessment & Plan Note (Signed)
Prior lumbar x-ray unremarkable.  Pain also affects her neck and body diffusely which I suspect stress is a contributor.  May benefit from physical therapy so we will send referral.

## 2021-01-06 NOTE — Progress Notes (Signed)
    SUBJECTIVE:   CHIEF COMPLAINT / HPI:   Patient has been previously seen for low back pain in clinic.  She has had x-ray of the lumbar spine without any abnormal findings other than straightening of lumbar lordosis.  She had reported pain in her shoulders, back and bilateral legs at last visit 3 months ago.  She was started on cyclobenzaprine 5 mg.   In the interim, she did see neurology Dr. Terrace Arabia and ultimately had an MRI brain which was unremarkable other than minimal chronic changes. She had been started on duloxetine 60 mg but had an adverse reaction (general weakness, difficulty breathing) and went to the ED so has stopped the medication.  Today, patient is accompanied by sponsor and in person Dari interpreter.  She expresses multiple concerns.  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  Family members have gone into hiding but are safe for the time being. Hair loss - has tried Head and Shoulders shampoo Chronic pain in back and neck - cyclobenzaprine helps Ran out of rosuvastatin 1 month ago, concerned about adverse effects from missing medication  PERTINENT  PMH / PSH: Latent TB, depression, hyperlipidemia, strabismus  OBJECTIVE:   BP 109/73   Pulse 66   Ht 5\' 3"  (1.6 m)   Wt 164 lb (74.4 kg)   SpO2 99%   BMI 29.05 kg/m   General: Overweight middle-aged woman, NAD HEENT: Scalp appears normal without any scaling.  There is central whitening of hair with small area of alopecia Eyes: Esotropic strabismus CV: RRR, no murmurs Pulm: CTAB, no wheezes or rales MSK: No midline spinous or paraspinal muscle tenderness  ASSESSMENT/PLAN:   Depression Worsening in light of stress around political situation in where some of her family members are still present.  Reporting worsening sleep.  Will try increasing mirtazapine to 15 mg.  Would likely benefit from therapy if patient willing.  Lumbar back  pain Prior lumbar x-ray unremarkable.  Pain also affects her neck and body diffusely which I suspect stress is a contributor.  May benefit from physical therapy so we will send referral.  Dyslipidemia Reassurance provided that visit rosuvastatin will have no adverse effects.  Will refill at next visit and will plan to recheck lipid panel then.   Hair loss Most recent TSH normal.  Scalp appears normal, no evidence of fungal infection.  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  [place], MD [facility]

## 2021-01-06 NOTE — Patient Instructions (Signed)
It was nice seeing you today! ° ° ° °Please arrive at least 15 minutes prior to your scheduled appointments. ° °Stay well, °Payson Crumby, MD °[facility] °[phone]  °

## 2021-01-06 NOTE — Assessment & Plan Note (Signed)
Worsening in light of stress around political situation in [place] where some of her family members are still present.  Reporting worsening sleep.  Will try increasing mirtazapine to 15 mg.  Would likely benefit from therapy if patient willing.

## 2021-02-07 ENCOUNTER — Ambulatory Visit: Payer: Medicaid Other | Admitting: Physical Therapy

## 2021-02-15 ENCOUNTER — Ambulatory Visit: Payer: Medicaid Other | Attending: Family Medicine

## 2021-02-15 ENCOUNTER — Other Ambulatory Visit: Payer: Self-pay

## 2021-02-15 DIAGNOSIS — M5386 Other specified dorsopathies, lumbar region: Secondary | ICD-10-CM | POA: Insufficient documentation

## 2021-02-15 DIAGNOSIS — M5410 Radiculopathy, site unspecified: Secondary | ICD-10-CM | POA: Diagnosis present

## 2021-02-15 DIAGNOSIS — M545 Low back pain, unspecified: Secondary | ICD-10-CM | POA: Diagnosis present

## 2021-02-16 NOTE — Therapy (Signed)
[facility] Outpatient Rehabilitation [facility] [address] [place], [place], [address] Phone: [phone]   Fax:  [phone]  Physical Therapy Evaluation  Patient Details  Name: Joyce Frye MRN: [id number] Date of Birth: 11-Feb-1971 Referring Provider (PT): Billey Co, MD   Encounter Date: 02/15/2021   PT End of Session - 02/16/21 2141     Visit Number 1    Number of Visits 17    Date for PT Re-Evaluation 04/22/21    Authorization Type [place] MEDICAID HEALTHY BLUE    PT Start Time 1506    PT Stop Time 1555    PT Time Calculation (min) 49 min    Activity Tolerance Patient tolerated treatment well    Behavior During Therapy WFL for tasks assessed/performed             Past Medical History:  Diagnosis Date   Cerebrovascular disease    Chronic headache    Strabismus    Stroke (HCC)    TB lung, latent     Past Surgical History:  Procedure Laterality Date   CHOLECYSTECTOMY      There were no vitals filed for this visit.    Subjective Assessment - 02/15/21 1515     Subjective Pt reports a recent issue of low back pain, L>R, beginning 4 to 5 months folllowing vacuuming. Pt states she has pain radiating down the front of her legs to both her great toes L > R. Pt reports she has increased pain in the morning. Pt notes she first injuried her low back approx 1 year ago also while vacuuming. Pt states she recovered from this first low back injury.    Pertinent History Cerebrovascular disease     Chronic headache     Strabismus     Stroke [facility])     TB lung, latent    Diagnostic tests 08/19/20: IMPRESSION:  No acute osseous abnormality or significant degenerative change in  the lumbar spine.    Patient Stated Goals To have less pain    Currently in Pain? Yes    Pain Score 6    pain range 6-9/10   Pain Location Back    Pain Orientation Right;Left;Posterior;Lower    Pain Descriptors / Indicators Burning    Pain Type Chronic pain    Pain Radiating Towards  Pain down the front both legs L>R    Pain Onset More than a month ago    Pain Frequency Constant    Aggravating Factors  Lifting heavy things, stiff in the morning    Pain Relieving Factors Sitting                [facility] PT Assessment - 02/16/21 2049       Assessment   Medical Diagnosis Lumbar back pain    Referring Provider (PT) Pray, Milus Mallick, MD    Onset Date/Surgical Date --   over a year   Hand Dominance Right    Prior Therapy No      Precautions   Precautions None      Restrictions   Weight Bearing Restrictions No      Balance Screen   Has the patient fallen in the past 6 months No      Home Environment   Living Environment Private residence    Living Arrangements Children    Type of Home House    Home Access Stairs to enter    Entrance Stairs-Number of Steps 8    Entrance Stairs-Rails Right  Home Layout One level      Prior Function   Level of Independence Independent      Cognition   Overall Cognitive Status Within Functional Limits for tasks assessed      Observation/Other Assessments   Focus on Therapeutic Outcomes (FOTO)  NA-MCD      Sensation   Light Touch Appears Intact      Posture/Postural Control   Posture/Postural Control No significant limitations      Deep Tendon Reflexes   DTR Assessment Site Patella;Achilles    Patella DTR 2+   R and L equal   Achilles DTR 2+   R and L equal     ROM / Strength   AROM / PROM / Strength AROM;Strength      AROM   Overall AROM Comments All trunk ROMs were moderately decreased and pt reported increased pain with each trunk motion      Strength   Overall Strength Comments LE myotomal screen negative with with L and R  MMTs equal at 4+ to 5/5      Palpation   Palpation comment Grossly TTP of the L and R paraspinals      Transfers   Transfers Sit to Stand;Stand to Sit    Sit to Stand 7: Independent      Ambulation/Gait   Ambulation/Gait Yes    Ambulation/Gait Assistance 7: Independent     Gait Pattern Within Functional Limits;Step-through pattern                        Objective measurements completed on examination: See above findings.                PT Education - 02/16/21 2140     Education Details Eval findings, POC, HEP    Person(s) Educated Patient;Other (comment)   sponsor- Kat   Methods Explanation;Demonstration;Tactile cues;Verbal cues;Handout    Comprehension Verbalized understanding;Returned demonstration;Verbal cues required;Tactile cues required              PT Short Term Goals - 02/16/21 2230       PT SHORT TERM GOAL #1   Title Pt will be Ind in an initial HEP    Baseline Started on eval    Status New    Target Date 03/09/21      PT SHORT TERM GOAL #2   Title Pt will voice understanding of measures to assist in the reduction and mangement of low back and LE radicular pain    Status New    Target Date 03/09/21               PT Long Term Goals - 02/16/21 2235       PT LONG TERM GOAL #1   Title Pt will be ind in a final HEP to maintain achieved LOF    Status New    Target Date 04/22/21      PT LONG TERM GOAL #2   Title Pt will demonstrate improved trunk mobility with minimal to no limitation c trunk movements    Baseline Moderate limitations of trunk ROMs in all movement planes    Status New    Target Date 04/22/21      PT LONG TERM GOAL #3   Title Pt will report centralization of B LE pain to the buttock and and a reduction of low back pain to 4/10 or less with daily activities    Baseline 6-9/10 pain range with daily activities  Status New    Target Date 04/22/21      PT LONG TERM GOAL #4   Title Pt will demonstrated proper body mechanics for daily activities for reduction of pain and prevenetion of re-injury    Baseline Both incidents of pt developing pain were related to vacuuming.    Status New    Target Date 04/22/21                    Plan - 02/16/21 2153     Clinical  Impression Statement Pt presents with an episode of low back and BLE pain which started approx 5 months ago. Pt reports a similar incident of low back pain occurring approx 1 year ago from which she recovered. Pt reports she is experiencing pain down the front of both her legs. Eval revealed a moderate decrease in trunk ROM limited by pain. Reflexes and myotomal screen were found to be negative. A directional movement preference assessment was completed with supine SKTC increasing pain, prone lying decreasing low back pain, and prone press ups increasing pain. Recommened a HEP for prone lying 6 to 8 times daily to reduce low back and radicular pain. Pt will benefit from skilled PT 2w8 to address deficits, reduce pain, opimize function and improve QOL.    Personal Factors and Comorbidities Time since onset of injury/illness/exacerbation;Past/Current Experience;Comorbidity 1;Comorbidity 2;Comorbidity 3+    Comorbidities Cerebrovascular disease     Chronic headache     Strabismus     Stroke (HCC)     TB lung, latent    Examination-Activity Limitations Lift    Examination-Participation Restrictions Cleaning    Stability/Clinical Decision Making Evolving/Moderate complexity    Clinical Decision Making Moderate    Rehab Potential Good    PT Frequency 2x / week    PT Duration 8 weeks    PT Treatment/Interventions ADLs/Self Care Home Management;Aquatic Therapy;Cryotherapy;Electrical Stimulation;Iontophoresis 4mg /ml Dexamethasone;Moist Heat;Traction;Neuromuscular re-education;Therapeutic exercise;Therapeutic activities;Patient/family education;Manual techniques;Dry needling;Taping;Spinal Manipulations    PT Next Visit Plan Assess response to HEP    PT Home Exercise Plan    Consulted and Agree with Plan of Care Patient             Patient will benefit from skilled therapeutic intervention in order to improve the following deficits and impairments:  Decreased range of motion, Decreased activity  tolerance, Pain  Visit Diagnosis: Lumbar back pain  Radicular leg pain  Decreased ROM of lumbar spine     Problem List Patient Active Problem List   Diagnosis Date Noted   Paresthesia 09/29/2020   Depression 09/29/2020   Abnormal vision 09/29/2020   Lumbar back pain 08/21/2020   Dyslipidemia 05/28/2020   Refugee health examination 04/19/2020   Strabismus 04/19/2020   TB lung, latent 04/19/2020    06/17/2020 MS, PT 02/16/21 10:52 PM   [facility] Outpatient Rehabilitation [facility] [address] [place], [place], [place] Phone: [phone]   Fax:  [phone]  Name: Joyce Frye MRN: Marina Gravel Date of Birth: Jun 20, 1970  Check all possible CPT codes: 97110- Therapeutic Exercise, [phone]- Neuro Re-education, [phone] - Gait Training, [phone] - Manual Therapy, 97530 - Therapeutic Activities, 97535 - Self Care, [phone] - Electrical stimulation (Manual), and [id number] - Ultrasound

## 2021-03-01 ENCOUNTER — Ambulatory Visit: Payer: Medicaid Other | Attending: Family Medicine | Admitting: Physical Therapy

## 2021-03-01 ENCOUNTER — Encounter: Payer: Self-pay | Admitting: Physical Therapy

## 2021-03-01 ENCOUNTER — Other Ambulatory Visit: Payer: Self-pay

## 2021-03-01 DIAGNOSIS — M545 Low back pain, unspecified: Secondary | ICD-10-CM | POA: Diagnosis not present

## 2021-03-01 DIAGNOSIS — M5386 Other specified dorsopathies, lumbar region: Secondary | ICD-10-CM | POA: Insufficient documentation

## 2021-03-01 DIAGNOSIS — M5410 Radiculopathy, site unspecified: Secondary | ICD-10-CM | POA: Insufficient documentation

## 2021-03-01 NOTE — Therapy (Signed)
[facility] Outpatient Rehabilitation [facility] [address] [place], [place], [address] Phone: [phone]   Fax:  [phone]  Physical Therapy Treatment  Patient Details  Name: Joyce Frye MRN: [id number] Date of Birth: May 08, 1970 Referring Provider (PT): Billey Co, MD   Encounter Date: 03/01/2021   PT End of Session - 03/01/21 1532     Visit Number 2    Number of Visits 17    Date for PT Re-Evaluation 04/22/21    Authorization Type [place] [facility] MEDICAID HEALTHY BLUE    PT Start Time 0331    PT Stop Time 0414    PT Time Calculation (min) 43 min    Activity Tolerance Patient tolerated treatment well    Behavior During Therapy [facility] for tasks assessed/performed             Past Medical History:  Diagnosis Date   Cerebrovascular disease    Chronic headache    Strabismus    Stroke (HCC)    TB lung, latent     Past Surgical History:  Procedure Laterality Date   CHOLECYSTECTOMY      There were no vitals filed for this visit.   Subjective Assessment - 03/01/21 1536     Subjective Pt reports that the exercises at home have been helping.  She has 5/10 back pain today with some anterior thigh pain.    Patient is accompained by: Interpreter   in person   Pertinent History Cerebrovascular disease     Chronic headache     Strabismus     Stroke [facility])     TB lung, latent    Diagnostic tests 08/19/20: IMPRESSION:  No acute osseous abnormality or significant degenerative change in  the lumbar spine.    Patient Stated Goals To have less pain    Pain Onset More than a month ago             [facility] Adult PT Treatment/Exercise:  Therapeutic Exercise: - nu-step L5 27m while taking subjective and planning session with patient - LTR 20x  - PPT - 10x  - w/ 5'' hold 10x - Hip adduction ball squeeze - 5'' - 8x - alternating clam - RTB - 2x10 - bridge: unable d/t pain - updating and reviewing HEP  Manual therapy, concentrating on increasing extensibility of  restricted tissue to reduce discomfort and improve mechanics in functional movement: - CPA and UPA, pt in prone, G II-III    PT Short Term Goals - 02/16/21 2230       PT SHORT TERM GOAL #1   Title Pt will be Ind in an initial HEP    Baseline Started on eval    Status New    Target Date 03/09/21      PT SHORT TERM GOAL #2   Title Pt will voice understanding of measures to assist in the reduction and mangement of low back and LE radicular pain    Status New    Target Date 03/09/21               PT Long Term Goals - 02/16/21 2235       PT LONG TERM GOAL #1   Title Pt will be ind in a final HEP to maintain achieved LOF    Status New    Target Date 04/22/21      PT LONG TERM GOAL #2   Title Pt will demonstrate improved trunk mobility with minimal to no limitation c trunk movements    Baseline  Moderate limitations of trunk ROMs in all movement planes    Status New    Target Date 04/22/21      PT LONG TERM GOAL #3   Title Pt will report centralization of B LE pain to the buttock and and a reduction of low back pain to 4/10 or less with daily activities    Baseline 6-9/10 pain range with daily activities    Status New    Target Date 04/22/21      PT LONG TERM GOAL #4   Title Pt will demonstrated proper body mechanics for daily activities for reduction of pain and prevenetion of re-injury    Baseline Both incidents of pt developing pain were related to vacuuming.    Status New    Target Date 04/22/21                   Plan - 03/01/21 1619     Clinical Impression Statement Overall, Stacye is progressing well with therapy.  Pt reports significant pain reduction following therapy (>/= 3 pts NPRS).  Today we concentrated on core strengthening, hip strengthening, and lumbar mobility.  Pt responds well to prone CPA and UPA with reduction in pain from 5/10 to 2-3/10.  Gross core and hip weakness.  Seems to have ext preference.  Pt will continue to benefit from skilled  physical therapy to address remaining deficits and achieve listed goals.  Continue per POC.    Personal Factors and Comorbidities Time since onset of injury/illness/exacerbation;Past/Current Experience;Comorbidity 1;Comorbidity 2;Comorbidity 3+    Comorbidities Cerebrovascular disease     Chronic headache     Strabismus     Stroke (HCC)     TB lung, latent    Examination-Activity Limitations Lift    Examination-Participation Restrictions Cleaning    Stability/Clinical Decision Making Evolving/Moderate complexity    Rehab Potential Good    PT Frequency 2x / week    PT Duration 8 weeks    PT Treatment/Interventions ADLs/Self Care Home Management;Aquatic Therapy;Cryotherapy;Electrical Stimulation;Iontophoresis 4mg /ml Dexamethasone;Moist Heat;Traction;Neuromuscular re-education;Therapeutic exercise;Therapeutic activities;Patient/family education;Manual techniques;Dry needling;Taping;Spinal Manipulations    PT Next Visit Plan Assess response to HEP    PT Home Exercise Plan    Consulted and Agree with Plan of Care Patient             Patient will benefit from skilled therapeutic intervention in order to improve the following deficits and impairments:  Decreased range of motion, Decreased activity tolerance, Pain  Visit Diagnosis: Lumbar back pain  Radicular leg pain  Decreased ROM of lumbar spine     Problem List Patient Active Problem List   Diagnosis Date Noted   Paresthesia 09/29/2020   Depression 09/29/2020   Abnormal vision 09/29/2020   Lumbar back pain 08/21/2020   Dyslipidemia 05/28/2020   Refugee health examination 04/19/2020   Strabismus 04/19/2020   TB lung, latent 04/19/2020    06/17/2020, PT 03/01/2021, 4:20 PM  [facility] Outpatient Rehabilitation [facility] [address] [place], [place], [place] Phone: [phone]   Fax:  [phone]  Name: Joyce Frye MRN: Marina Gravel Date of Birth: 1971-03-02

## 2021-03-08 ENCOUNTER — Other Ambulatory Visit: Payer: Self-pay

## 2021-03-08 ENCOUNTER — Encounter: Payer: Self-pay | Admitting: Physical Therapy

## 2021-03-08 ENCOUNTER — Ambulatory Visit: Payer: Medicaid Other | Admitting: Physical Therapy

## 2021-03-08 DIAGNOSIS — M5410 Radiculopathy, site unspecified: Secondary | ICD-10-CM | POA: Diagnosis not present

## 2021-03-08 DIAGNOSIS — M5386 Other specified dorsopathies, lumbar region: Secondary | ICD-10-CM | POA: Diagnosis not present

## 2021-03-08 DIAGNOSIS — M545 Low back pain, unspecified: Secondary | ICD-10-CM

## 2021-03-08 NOTE — Therapy (Signed)
[facility] Outpatient Rehabilitation [facility] [address] [place], [place], [address] Phone: [phone]   Fax:  [phone]  Physical Therapy Treatment  Patient Details  Name: Joyce Frye MRN: [id number] Date of Birth: 1970-06-03 Referring Provider (PT): Billey Co, MD   Encounter Date: 03/08/2021   PT End of Session - 03/08/21 1616     Visit Number 3    Number of Visits 17    Date for PT Re-Evaluation 04/22/21    Authorization Type [facility] MEDICAID HEALTHY BLUE    PT Start Time [phone]    PT Stop Time 0500    PT Time Calculation (min) 45 min    Activity Tolerance Patient tolerated treatment well    Behavior During Therapy [facility] for tasks assessed/performed             Past Medical History:  Diagnosis Date   Cerebrovascular disease    Chronic headache    Strabismus    Stroke (HCC)    TB lung, latent     Past Surgical History:  Procedure Laterality Date   CHOLECYSTECTOMY      There were no vitals filed for this visit.   Subjective Assessment - 03/08/21 1620     Subjective Pt reports that her back feel somewhat improved from last visit.  She has 5/10 back pain today with some anterior thigh pain.    Patient is accompained by: Interpreter   in person   Pertinent History Cerebrovascular disease     Chronic headache     Strabismus     Stroke [facility])     TB lung, latent    Diagnostic tests 08/19/20: IMPRESSION:  No acute osseous abnormality or significant degenerative change in  the lumbar spine.    Patient Stated Goals To have less pain    Pain Onset More than a month ago             [facility] Adult PT Treatment/Exercise:   Therapeutic Exercise: - nu-step L6 91m while taking subjective and planning session with patient - LTR 20x  - R sided sciatic nerve glide - bolster under R knee - 20x - PPT - 10x             - w/ 5'' hold 10x (NT) - Hip adduction ball squeeze - x10 - alternating clam - GTB - 2x10 - Core activation with bil shoulder ext -  BTB - 2x7 - bridge: small ROM 2x5 - reviewing HEP   Manual therapy, concentrating on increasing extensibility of restricted tissue to reduce discomfort and improve mechanics in functional movement: - CPA and UPA, pt in prone, G II-III     PT Short Term Goals - 02/16/21 2230       PT SHORT TERM GOAL #1   Title Pt will be Ind in an initial HEP    Baseline Started on eval    Status New    Target Date 03/09/21      PT SHORT TERM GOAL #2   Title Pt will voice understanding of measures to assist in the reduction and mangement of low back and LE radicular pain    Status New    Target Date 03/09/21               PT Long Term Goals - 02/16/21 2235       PT LONG TERM GOAL #1   Title Pt will be ind in a final HEP to maintain achieved LOF    Status New  Target Date 04/22/21      PT LONG TERM GOAL #2   Title Pt will demonstrate improved trunk mobility with minimal to no limitation c trunk movements    Baseline Moderate limitations of trunk ROMs in all movement planes    Status New    Target Date 04/22/21      PT LONG TERM GOAL #3   Title Pt will report centralization of B LE pain to the buttock and and a reduction of low back pain to 4/10 or less with daily activities    Baseline 6-9/10 pain range with daily activities    Status New    Target Date 04/22/21      PT LONG TERM GOAL #4   Title Pt will demonstrated proper body mechanics for daily activities for reduction of pain and prevenetion of re-injury    Baseline Both incidents of pt developing pain were related to vacuuming.    Status New    Target Date 04/22/21                    Patient will benefit from skilled therapeutic intervention in order to improve the following deficits and impairments:     Visit Diagnosis: Radicular leg pain  Lumbar back pain  Decreased ROM of lumbar spine     Problem List Patient Active Problem List   Diagnosis Date Noted   Paresthesia 09/29/2020   Depression  09/29/2020   Abnormal vision 09/29/2020   Lumbar back pain 08/21/2020   Dyslipidemia 05/28/2020   Refugee health examination 04/19/2020   Strabismus 04/19/2020   TB lung, latent 04/19/2020    Fredderick Phenix, PT 03/08/2021, 5:09 PM  [facility] Outpatient Rehabilitation [facility] [address] [place], [place], [address] Phone: [phone]   Fax:  [phone]  Name: Joyce Frye MRN: [id number] Date of Birth: 23-Dec-1970

## 2021-03-15 ENCOUNTER — Other Ambulatory Visit: Payer: Self-pay

## 2021-03-15 ENCOUNTER — Ambulatory Visit: Payer: Medicaid Other

## 2021-03-15 DIAGNOSIS — M545 Low back pain, unspecified: Secondary | ICD-10-CM

## 2021-03-15 DIAGNOSIS — M5410 Radiculopathy, site unspecified: Secondary | ICD-10-CM

## 2021-03-15 DIAGNOSIS — M5386 Other specified dorsopathies, lumbar region: Secondary | ICD-10-CM

## 2021-03-15 NOTE — Therapy (Signed)
[place], [place], [address] Phone: [phone]   Fax:  [phone]  Physical Therapy Treatment/Discharge  Patient Details  Name: Joyce Frye MRN: [id number] Date of Birth: 20-Jun-1970 Referring Provider (PT): Lenoria Chime, MD   Encounter Date: 03/15/2021   PT End of Session - 03/15/21 1650     Visit Number 4    Number of Visits 17    Date for PT Re-Evaluation 04/22/21    Authorization Type [place] MEDICAID HEALTHY BLUE    PT Start Time 1638    PT Stop Time 1725    PT Time Calculation (min) 47 min    Activity Tolerance Patient tolerated treatment well    Behavior During Therapy [facility] for tasks assessed/performed             Past Medical History:  Diagnosis Date   Cerebrovascular disease    Chronic headache    Strabismus    Stroke ([place])    TB lung, latent     Past Surgical History:  Procedure Laterality Date   CHOLECYSTECTOMY      There were no vitals filed for this visit.   Subjective Assessment - 03/15/21 1643     Subjective Pt reports her low back is a little beter.    Pertinent History Cerebrovascular disease     Chronic headache     Strabismus     Stroke [facility])     TB lung, latent    Diagnostic tests 08/19/20: IMPRESSION:  No acute osseous abnormality or significant degenerative change in  the lumbar spine.    Patient Stated Goals To have less pain    Currently in Pain? Yes    Pain Score 5     Pain Location Back    Pain Orientation Right;Left    Pain Descriptors / Indicators Burning    Pain Radiating Towards L LE    Pain Onset More than a month ago                [facility] PT Assessment - 03/15/21 0001       AROM   Overall AROM Comments All trunk movements were min limited or WFLs cpain reproduced c FF, L LF, L rotation                          [facility] Adult PT Treatment/Exercise:   Therapeutic Exercise:  - PPT - 2x10             - w/ 3'' hold - Hip  adduction ball squeeze - 2x10, 3" - Clam - GTB - 2x10 - Hip add sets 2x10, 3" - bridge: small ROM 2x10             PT Education - 03/15/21 1741     Education Details Final HEP developed and provided. Reiewed results of reeval    Person(s) Educated Patient;Child(ren)    Methods Explanation;Demonstration;Tactile cues;Verbal cues;Handout    Comprehension Verbalized understanding;Returned demonstration;Verbal cues required;Tactile cues required              PT Short Term Goals - 03/15/21 1743       PT SHORT TERM GOAL #1   Title Pt will be Ind in an initial HEP    Baseline Started on eval    Status Achieved    Target Date 03/15/21      PT SHORT TERM GOAL #2   Title Pt will voice understanding of measures to  assist in the reduction and mangement of low back and LE radicular pain    Status Achieved    Target Date 03/15/21               PT Long Term Goals - 03/15/21 1746       PT LONG TERM GOAL #1   Title Pt will be ind in a final HEP to maintain achieved LOF    Status Achieved    Target Date 03/15/21      PT LONG TERM GOAL #2   Title Pt will demonstrate improved trunk mobility with minimal to no limitation c trunk movements.03/15/21; All trunk movements were min ot WNLs for ROM    Baseline Moderate limitations of trunk ROMs in all movement planes    Status Achieved    Target Date 03/15/21      PT LONG TERM GOAL #3   Title Pt will report centralization of B LE pain to the buttock and and a reduction of low back pain to 4/10 or less with daily activities. 03/15/21: LBP 5/10 with intermittent L LE pain    Baseline 6-9/10 pain range with daily activities    Status Partially Met      PT LONG TERM GOAL #4   Title Pt will demonstrated proper body mechanics for daily activities for reduction of pain and prevenetion of re-injury. 03/15/21: deferred with pt wishing to DC PT    Status Deferred                   Plan - 03/15/21 1742     Personal  Factors and Comorbidities Time since onset of injury/illness/exacerbation;Past/Current Experience;Comorbidity 1;Comorbidity 2;Comorbidity 3+    Comorbidities Cerebrovascular disease     Chronic headache     Strabismus     Stroke (HCC)     TB lung, latent    Examination-Activity Limitations Lift    Examination-Participation Restrictions Cleaning    Stability/Clinical Decision Making Evolving/Moderate complexity    Clinical Decision Making Moderate    Rehab Potential Good    PT Frequency 2x / week    PT Duration 8 weeks    PT Treatment/Interventions ADLs/Self Care Home Management;Aquatic Therapy;Cryotherapy;Electrical Stimulation;Iontophoresis 40m/ml Dexamethasone;Moist Heat;Traction;Neuromuscular re-education;Therapeutic exercise;Therapeutic activities;Patient/family education;Manual techniques;Dry needling;Taping;Spinal Manipulations    PT Home Exercise Plan [email or web address]   Consulted and Agree with Plan of Care Patient;Family member/caregiver    Family Member Consulted son             Patient will benefit from skilled therapeutic intervention in order to improve the following deficits and impairments:  Decreased range of motion, Decreased activity tolerance, Pain  Visit Diagnosis: Lumbar back pain  Radicular leg pain  Decreased ROM of lumbar spine     Problem List Patient Active Problem List   Diagnosis Date Noted   Paresthesia 09/29/2020   Depression 09/29/2020   Abnormal vision 09/29/2020   Lumbar back pain 08/21/2020   Dyslipidemia 05/28/2020   Refugee health examination 04/19/2020   Strabismus 04/19/2020   TB lung, latent 04/19/2020  PHYSICAL THERAPY DISCHARGE SUMMARY  Visits from Start of Care: 4  Current functional level related to goals / functional outcomes: See above   Remaining deficits: See above   Education / Equipment: HEP   Patient agrees to discharge. Patient goals were partially met. Patient is being discharged due to the patient's  request.   AGar PontoMS, PT 03/15/21 6:00 PM   [place], [address] Phone: [phone]   Fax:  [phone]  Name: Joyce Frye MRN: [id number] Date of Birth: 25-Jan-1971

## 2021-07-04 ENCOUNTER — Ambulatory Visit (INDEPENDENT_AMBULATORY_CARE_PROVIDER_SITE_OTHER): Payer: Medicaid Other | Admitting: Family Medicine

## 2021-07-04 ENCOUNTER — Other Ambulatory Visit: Payer: Self-pay | Admitting: Family Medicine

## 2021-07-04 VITALS — BP 108/70 | HR 71 | Wt 152.2 lb

## 2021-07-04 DIAGNOSIS — F419 Anxiety disorder, unspecified: Secondary | ICD-10-CM

## 2021-07-04 DIAGNOSIS — E785 Hyperlipidemia, unspecified: Secondary | ICD-10-CM

## 2021-07-04 DIAGNOSIS — R079 Chest pain, unspecified: Secondary | ICD-10-CM | POA: Diagnosis not present

## 2021-07-04 MED ORDER — CYCLOBENZAPRINE HCL 5 MG PO TABS
5.0000 mg | ORAL_TABLET | Freq: Three times a day (TID) | ORAL | 0 refills | Status: DC | PRN
Start: 2021-07-04 — End: 2021-07-04

## 2021-07-04 MED ORDER — HYDROXYZINE HCL 10 MG PO TABS: 10.0000 mg | ORAL_TABLET | Freq: Three times a day (TID) | ORAL | 0 refills | Status: DC | PRN

## 2021-07-04 NOTE — Progress Notes (Signed)
? ? ?  SUBJECTIVE:  ? ?CHIEF COMPLAINT / HPI:  ? ?Patient is [place] and interpreter was used for entire visit.  Patient's sponsor is also present for the visit. ? ?Chest pain and anxiety ?Patient presents to our clinic for evaluation for chest pain.  She reports the chest pain started 2 days ago and is in her chest and left arm.  She reports that she has been under considerable stress over the past few weeks and her husband was killed by the Taliban approximately 2 weeks ago.  She is currently trying to get her children to the [place] who are still in [place].  She reports she notices the pain more when she is thinking about her family.  She does acknowledge that she has tenderness to palpation in her chest as well as in her left shoulder.  She has not taken any medications for it because she does not want to take too much medication.  Reports that the only medication she has been taking his Crestor and Remeron.  She was unable to tolerate the 15 mg Remeron so has been taking 7.5 mg Remeron. ? ? ?OBJECTIVE:  ? ?BP 108/70   Pulse 71   Wt 152 lb 3.2 oz (69 kg)   SpO2 99%   BMI 26.96 kg/m?   ?General: Pleasant, intermittently tearful 51 year old female ?Cardiac: Regular rate and rhythm, no murmurs appreciated ?Respiratory: Normal work of breathing, lungs are clear to auscultation bilaterally ?Abdomen: Soft, nontender ?MSK: Patient with tenderness to palpation of left sternal border as well as lateral aspect of left shoulder.  Patient is able to pinpoint the pain and it is in the muscle. ?ASSESSMENT/PLAN:  ? ?Chest pain ?2-day history of chest pain associated with anxiety.  EKG today was normal sinus rhythm.  Feel that the patient's chest pain is muscular in origin.  Discussed over-the-counter medications such as ibuprofen.  Patient is concerned about taking too much ibuprofen so recommended 200 mg every 6 hours as needed when she is having the pain.  Strict ED precautions given ? ?Anxiety ?Patient with considerable  anxiety regarding her family which is understandable.  Currently taking Remeron for depression.  Discussed other medication options and decided to trial hydroxyzine for patient's anxiety.  10 mg 3 times daily.  Follow-up in 2 to 4 weeks. ?  ? ? ?Derrel Nip, MD ?[facility]  ?Chest pain ?

## 2021-07-04 NOTE — Patient Instructions (Addendum)
It was a pleasure seeing you today.  I am so sorry to hear about your family.  We completed an EKG today which did not show any abnormalities in your heart.  I feel that the pain is likely muscular and I have sent a new prescription for Flexeril to your pharmacy to help with muscle spasms.  You can also take over-the-counter medications such as ibuprofen intermittently.  Regarding anxiety I have sent a medication called Atarax which may make you sleepy but can help with anxiety.  Take this as needed up to 3 times daily.  I would like for you to be seen by her PCP in the next 2 weeks to a month to check on how your anxiety is doing.  If your chest pain worsens or you have shortness of breath please be reevaluated.  If you have any questions or concerns call the clinic.   ?

## 2021-07-05 DIAGNOSIS — R079 Chest pain, unspecified: Secondary | ICD-10-CM | POA: Insufficient documentation

## 2021-07-05 DIAGNOSIS — F419 Anxiety disorder, unspecified: Secondary | ICD-10-CM | POA: Insufficient documentation

## 2021-07-05 NOTE — Assessment & Plan Note (Signed)
2-day history of chest pain associated with anxiety.  EKG today was normal sinus rhythm.  Feel that the patient's chest pain is muscular in origin.  Discussed over-the-counter medications such as ibuprofen.  Patient is concerned about taking too much ibuprofen so recommended 200 mg every 6 hours as needed when she is having the pain.  Strict ED precautions given ?

## 2021-07-05 NOTE — Assessment & Plan Note (Signed)
Patient with considerable anxiety regarding her family which is understandable.  Currently taking Remeron for depression.  Discussed other medication options and decided to trial hydroxyzine for patient's anxiety.  10 mg 3 times daily.  Follow-up in 2 to 4 weeks. ?

## 2021-07-24 ENCOUNTER — Encounter: Payer: Self-pay | Admitting: Family Medicine

## 2021-07-24 ENCOUNTER — Ambulatory Visit: Payer: Medicaid Other | Admitting: Family Medicine

## 2021-07-24 DIAGNOSIS — E785 Hyperlipidemia, unspecified: Secondary | ICD-10-CM

## 2021-07-24 DIAGNOSIS — F419 Anxiety disorder, unspecified: Secondary | ICD-10-CM

## 2021-07-24 MED ORDER — ROSUVASTATIN CALCIUM 5 MG PO TABS: ORAL_TABLET | ORAL | 3 refills | Status: DC

## 2021-07-24 NOTE — Progress Notes (Signed)
    SUBJECTIVE:   CHIEF COMPLAINT / HPI:   Patient presents with her family and interpreter for follow-up regarding her mood. Patient was recently seen regarding her anxiety and depression with significant family events with her husband passing away several weeks ago and still being separated from some of her children who remain overseas. Patient did not tolerate a higher dose of Remeron and is currently taking the 7.5mg  dose and is pleased overall with the results. She states that the hydroxyzine "gave me more anxiety" and so she is not taking that anymore.   PERTINENT  PMH / PSH: Reviewed  OBJECTIVE:   BP 103/64   Pulse 66   Ht 5\' 3"  (1.6 m)   Wt 152 lb 3.2 oz (69 kg)   SpO2 100%   BMI 26.96 kg/m   Gen: well-appearing, NAD CV: RRR, no m/r/g appreciated, no peripheral edema Pulm: CTAB, no wheezes/crackles  ASSESSMENT/PLAN:   Dyslipidemia Provided refill of rosuvastatin  Anxiety Patient with understandable anxiety secondary to her family situation. Patient did not do well with hydroxyzine trial, unclear if patient truly tried to take it at this time. Her daughters and son are protective and supportive factors.  - Continue remeron 7.5mg  daily (per pt report) - Consider trying the hydroxyzine for symptom improvement - Follow-up in the next 4 weeks or sooner if worsening     Rise Patience, DO [place]

## 2021-07-24 NOTE — Assessment & Plan Note (Signed)
Provided refill of rosuvastatin ?

## 2021-07-24 NOTE — Patient Instructions (Signed)
I am glad that you are doing fairly well on your medication. If you need to, you can always try the other anxiety medication as needed to also help with sleep.  ? ?If you feel like things are getting worse or you need more help, do not hesitate to reach out.  ?

## 2021-07-24 NOTE — Assessment & Plan Note (Signed)
Patient with understandable anxiety secondary to her family situation. Patient did not do well with hydroxyzine trial, unclear if patient truly tried to take it at this time. Her daughters and son are protective and supportive factors.  ?- Continue remeron 7.5mg  daily (per pt report) ?- Consider trying the hydroxyzine for symptom improvement ?- Follow-up in the next 4 weeks or sooner if worsening ?

## 2021-08-31 ENCOUNTER — Other Ambulatory Visit: Payer: Self-pay | Admitting: Family Medicine

## 2021-08-31 DIAGNOSIS — F4320 Adjustment disorder, unspecified: Secondary | ICD-10-CM

## 2021-10-16 ENCOUNTER — Encounter (HOSPITAL_COMMUNITY): Payer: Self-pay

## 2021-10-16 ENCOUNTER — Ambulatory Visit (HOSPITAL_COMMUNITY)
Admission: EM | Admit: 2021-10-16 | Discharge: 2021-10-16 | Disposition: A | Discharge status: Home / Self Care | Payer: Medicaid Other | Attending: Family Medicine | Admitting: Family Medicine

## 2021-10-16 DIAGNOSIS — R519 Headache, unspecified: Secondary | ICD-10-CM

## 2021-10-16 DIAGNOSIS — H538 Other visual disturbances: Secondary | ICD-10-CM | POA: Diagnosis not present

## 2021-10-16 DIAGNOSIS — R42 Dizziness and giddiness: Secondary | ICD-10-CM

## 2021-10-16 LAB — CBG MONITORING, ED: Glucose-Capillary: 112 mg/dL — ABNORMAL HIGH (ref 70–99)

## 2021-10-16 NOTE — ED Provider Notes (Signed)
[facility]    CSN: [id number] Arrival date & time: 10/16/21  1758      History   Chief Complaint Chief Complaint  Patient presents with   Headache   Dizziness    HPI Joyce Frye is a 51 y.o. female.   Patient presents to urgent care with her son for evaluation of 3 to 4-day history of dizziness, occipital headache, shortness of breath, "dark vision with standing", and weakness to the bilateral lower extremities with standing.  Patient has a history of a stroke in 2021 and has residual deficits from the stroke.  Dizziness is worse with head movement but does not go away when head is still.  Headache is currently a 5 on a scale of 0-10 and is worse when she stands up.  Shortness of breath is constant but patient denies cough, chest pain, abdominal pain, nausea, and fever/chills.  No urinary symptoms, constipation, diarrhea, sore throat, ear pain, eye drainage, nasal congestion, or confusion reported.  Patient states that when she stands, her vision "goes dark and her legs become weak".  She has not attempted use of any over-the-counter medications prior to arrival urgent care for her symptoms.  Patient has been drinking plenty of water and eating normally.  Patient last ate a couple of hours prior to arrival to urgent care and is not a diabetic.  Denies recent changes in medications, recent antibiotic use, and recent illnesses.  No other aggravating or relieving factors identified for patient's symptoms at this time.  Offered patient medical interpreter and patient declines.  Patient prefers to have her son interpret for patient encounter.   Headache Associated symptoms: dizziness   Dizziness Associated symptoms: headaches     Past Medical History:  Diagnosis Date   Cerebrovascular disease    Chronic headache    Strabismus    Stroke [facility])    TB lung, latent     Patient Active Problem List   Diagnosis Date Noted   Chest pain 07/05/2021   Anxiety 07/05/2021    Paresthesia 09/29/2020   Depression 09/29/2020   Abnormal vision 09/29/2020   Lumbar back pain 08/21/2020   Dyslipidemia 05/28/2020   Refugee health examination 04/19/2020   Strabismus 04/19/2020   TB lung, latent 04/19/2020    Past Surgical History:  Procedure Laterality Date   CHOLECYSTECTOMY      OB History   No obstetric history on file.      Home Medications    Prior to Admission medications   Medication Sig Start Date End Date Taking? Authorizing Provider  diclofenac Sodium (VOLTAREN) 1 % GEL Apply four times daily to affected area 08/19/20   Simmons-Robinson, Makiera, MD  hydrOXYzine (ATARAX) 10 MG tablet Take 1 tablet (10 mg total) by mouth 3 (three) times daily as needed. 07/04/21   Celedonio Savage, MD  isoniazid (NYDRAZID) 300 MG tablet Take 300 mg by mouth daily.    [provider]  mirtazapine (REMERON) 15 MG tablet TAKE 1 TABLET(15 MG) BY MOUTH AT BEDTIME 08/31/21   Littie Deeds, MD  pyridOXINE (VITAMIN B-6) 25 MG tablet Take 25 mg by mouth daily.    [provider]  rosuvastatin (CRESTOR) 5 MG tablet TAKE 2 TABLETS(10 MG) BY MOUTH AT BEDTIME 07/24/21   Lilland, Alana, DO    Family History History reviewed. No pertinent family history.  Social History Social History   Tobacco Use   Smoking status: Never   Smokeless tobacco: Never  Substance Use Topics   Alcohol  use: Never   Drug use: Never     Allergies   Paracetamol [acetaminophen]   Review of Systems Review of Systems  Neurological:  Positive for dizziness and headaches.  Per HPI   Physical Exam Triage Vital Signs ED Triage Vitals [10/16/21 1802]  Enc Vitals Group     BP 123/74     Pulse Rate 65     Resp 16     Temp 97.6 F (36.4 C)     Temp Source Oral     SpO2 99 %     Weight      Height      Head Circumference      Peak Flow      Pain Score      Pain Loc      Pain Edu?      Excl. in GC?    No data found.  Updated Vital Signs BP 123/74 (BP Location: Left  Arm)   Pulse 65   Temp 97.6 F (36.4 C) (Oral)   Resp 16   SpO2 99%   Visual Acuity Right Eye Distance:   Left Eye Distance:   Bilateral Distance:    Right Eye Near:   Left Eye Near:    Bilateral Near:     Physical Exam Vitals and nursing note reviewed.  Constitutional:      Appearance: Normal appearance. She is not ill-appearing or toxic-appearing.     Comments: Very pleasant patient sitting on exam in position of comfort table in no acute distress.   HENT:     Head: Normocephalic and atraumatic.     Right Ear: Hearing, tympanic membrane, ear canal and external ear normal.     Left Ear: Hearing, tympanic membrane, ear canal and external ear normal.     Nose: Nose normal.     Mouth/Throat:     Lips: Pink.     Mouth: Mucous membranes are moist.     Pharynx: Oropharynx is clear. Uvula midline. No posterior oropharyngeal erythema or uvula swelling.     Tonsils: No tonsillar exudate.  Eyes:     General: Lids are normal. Vision grossly intact. No scleral icterus.    Conjunctiva/sclera: Conjunctivae normal.     Comments: Gaze is inverted at baseline due to residual from stroke bilaterally.    Neck:     Thyroid: No thyroid mass.     Comments: Dizziness is mildly worsened with neck range of motion exercises with head movement from side to side and up and down.  Cardiovascular:     Rate and Rhythm: Normal rate and regular rhythm.     Heart sounds: Normal heart sounds, S1 normal and S2 normal.  Pulmonary:     Effort: Pulmonary effort is normal. No respiratory distress.     Breath sounds: Normal breath sounds and air entry.     Comments: Clear to auscultation bilaterally. Abdominal:     Palpations: Abdomen is soft.  Musculoskeletal:     Cervical back: Full passive range of motion without pain, normal range of motion and neck supple. No rigidity or crepitus.  Lymphadenopathy:     Cervical: No cervical adenopathy.  Skin:    General: Skin is warm and dry.     Capillary Refill:  Capillary refill takes less than 2 seconds.     Findings: No rash.  Neurological:     General: No focal deficit present.     Mental Status: She is alert and oriented to person, place, and time. Mental  status is at baseline.     GCS: GCS eye subscore is 4. GCS verbal subscore is 5. GCS motor subscore is 6.     Cranial Nerves: No dysarthria or facial asymmetry.     Sensory: Sensation is intact.     Motor: Motor function is intact. No weakness, tremor or pronator drift.     Coordination: Romberg sign negative. Coordination normal. Finger-Nose-Finger Test normal.     Gait: Gait is intact.     Comments: 5/5 power with grip strength to bilateral upper extremities.  5/5 strength with abduction and abduction of bilateral lower extremities.  No focal neurologic deficit identified to physical exam.  Patient has inverted gaze to bilateral eyes at baseline due to previous CVA.   Psychiatric:        Mood and Affect: Mood normal.        Speech: Speech normal.        Behavior: Behavior normal.        Thought Content: Thought content normal.        Judgment: Judgment normal.      UC Treatments / Results  Labs (all labs ordered are listed, but only abnormal results are displayed) Labs Reviewed  CBG MONITORING, ED - Abnormal; Notable for the following components:      Result Value   Glucose-Capillary 112 (*)    All other components within normal limits  CBG MONITORING, ED    EKG   Radiology No results found.  Procedures Procedures (including critical care time)  Medications Ordered in UC Medications - No data to display  Initial Impression / Assessment and Plan / UC Course  I have reviewed the triage vital signs and the nursing notes.  Pertinent labs & imaging results that were available during my care of the patient were reviewed by me and considered in my medical decision making (see chart for details).  1.  Dizziness, bad headache, blurry vision Patient symptoms and physical exam  are concerning for possible intracranial abnormality including possible TIA versus CVA.  Unsure of cause of patient's symptoms at this time.  No clinical indication for EKG at this time as patient is not having any chest discomfort and I have low suspicion for coronary cause for patient's symptoms at this time. Dizziness may be related to a benign positional vertigo etiology, but cannot rule out TIA versus subacute CVA with resources at urgent care.  For this reason, I recommend patient go to the nearest emergency department for further evaluation and management of symptoms, urgent lab work, and possible imaging.  CBG 112 in clinic.  Discussed recommendations and physical exam findings with patient/son who verbalized agreement and understanding of plan to go to the nearest emergency department for further evaluation.  Discussed risks of deferring emergency department visit and strict ED return precautions.  Patient is stable to be transported by personal vehicle to the nearest ED.  Patient discharged from urgent care with stable vital signs and in stable condition.  Patient ambulatory with steady gait without assistance out of urgent care and to the emergency room.  Final Clinical Impressions(s) / UC Diagnoses   Final diagnoses:  Dizziness  Bad headache  Blurry vision     Discharge Instructions      Please go to the emergency room for further evaluation and management of your shortness of breath, dizziness, blurry vision, and headache as I believe you would benefit from urgent blood work and possible imaging.  Return to urgent care as needed.  ED Prescriptions   None    PDMP not reviewed this encounter.   Carlisle Beers, [place] 10/19/21 2052

## 2021-10-16 NOTE — ED Triage Notes (Signed)
Pt reports headache for several days. She is taking Motrin.

## 2021-10-16 NOTE — ED Triage Notes (Signed)
Onset 3-4 days of dizzy spells.

## 2021-10-16 NOTE — Discharge Instructions (Addendum)
Please go to the emergency room for further evaluation and management of your shortness of breath, dizziness, blurry vision, and headache as I believe you would benefit from urgent blood work and possible imaging.  Return to urgent care as needed.

## 2021-12-22 ENCOUNTER — Encounter: Payer: Self-pay | Admitting: Family Medicine

## 2021-12-22 ENCOUNTER — Ambulatory Visit: Payer: Medicaid Other | Admitting: Family Medicine

## 2021-12-22 VITALS — BP 130/75 | HR 65 | Ht 63.0 in | Wt 158.8 lb

## 2021-12-22 DIAGNOSIS — M545 Low back pain, unspecified: Secondary | ICD-10-CM

## 2021-12-22 DIAGNOSIS — G44209 Tension-type headache, unspecified, not intractable: Secondary | ICD-10-CM | POA: Diagnosis not present

## 2021-12-22 DIAGNOSIS — F4320 Adjustment disorder, unspecified: Secondary | ICD-10-CM | POA: Diagnosis not present

## 2021-12-22 DIAGNOSIS — F419 Anxiety disorder, unspecified: Secondary | ICD-10-CM

## 2021-12-22 DIAGNOSIS — E785 Hyperlipidemia, unspecified: Secondary | ICD-10-CM | POA: Diagnosis not present

## 2021-12-22 MED ORDER — BACLOFEN 10 MG PO TABS: 10.0000 mg | ORAL_TABLET | Freq: Three times a day (TID) | ORAL | 0 refills | Status: DC

## 2021-12-22 MED ORDER — ROSUVASTATIN CALCIUM 10 MG PO TABS: 10.0000 mg | ORAL_TABLET | Freq: Every day | ORAL | 3 refills | Status: DC

## 2021-12-22 MED ORDER — MIRTAZAPINE 15 MG PO TABS: 15.0000 mg | ORAL_TABLET | Freq: Every day | ORAL | 3 refills | Status: DC

## 2021-12-22 NOTE — Progress Notes (Signed)
    SUBJECTIVE:   CHIEF COMPLAINT / HPI:   Back Pain  - Down to legs and up to neck - Present for 1-2 weeks - Also has pain in her left leg and swelling - Painful when walking around, present in her lower back/sacrum   Headaches - Start in the neck - Has had continued pain for the last 4 days - Has a lot of stress/worry - Tylenol and Ibuprofen 100mg  1-2 tablets at bedtime, which doesn't seem to help  Anxiety  - Not very well controlled - Current stressors of several of her children still being in their home country and unsafe  PERTINENT  PMH / PSH: Reviewed  OBJECTIVE:   BP 130/75   Pulse 65   Ht 5\' 3"  (1.6 m)   Wt 158 lb 12.8 oz (72 kg)   SpO2 100%   BMI 28.13 kg/m   General: NAD, well-appearing, well-nourished Respiratory: No respiratory distress, breathing comfortably, able to speak in full sentences Skin: warm and dry, no rashes noted on exposed skin Psych: Appropriate affect and mood MSK/back: - Inspection: no gross deformity or asymmetry, swelling or ecchymosis - Palpation: Diffuse palpation over the paraspinal muscles and SI joint, no tenderness over the spine.  Tenderness to palpation over the trapezius bilaterally - ROM: full active ROM of the lumbar spine in flexion and extension though accompanied by pain with flexion.  Full ROM of the thoracic and cervical spine - Strength: 5/5 strength of lower extremity in L4-S1 nerve root distributions b/l; normal gait - Neuro: sensation intact in the L4-S1 nerve root distribution    ASSESSMENT/PLAN:   Anxiety High anxiety state secondary to children in home country not being safe.  Reports being able to sleep since she is taking the mirtazapine and that she is tolerating it well. - Mirtazapine prescription refilled  Dyslipidemia Refill of rosuvastatin 10 mg provided  Lumbar back pain Persistent but seems to be present all over the spine.  Diffusely tender to palpation.  Suspect stress and anxiety are a large  contributor to this.  Attempted physical therapy previously but discontinued as she did not like it. - Baclofen 10 mg 3 times daily as needed (instructed to start off with 5 mg) - Anxiety management as able - Stretches and exercises for lower back and SI joint rehab given   Headaches Examination and history seem more consistent with tension/stress induced headaches with a component of cervicogenic etiology.  Given the significant tenseness of patient's muscles, do feel like muscle relaxer would help. - Muscle relaxer as above for back pain - Encouraged to maintain hydration - Stress and anxiety management as able - Tylenol and ibuprofen as needed - Continue to closely monitor  Joyce Frye, Joyce Frye

## 2021-12-22 NOTE — Assessment & Plan Note (Signed)
Persistent but seems to be present all over the spine.  Diffusely tender to palpation.  Suspect stress and anxiety are a large contributor to this.  Attempted physical therapy previously but discontinued as she did not like it. - Baclofen 10 mg 3 times daily as needed (instructed to start off with 5 mg) - Anxiety management as able - Stretches and exercises for lower back and SI joint rehab given

## 2021-12-22 NOTE — Assessment & Plan Note (Signed)
Refill of rosuvastatin 10 mg provided

## 2021-12-22 NOTE — Patient Instructions (Signed)
Today we discussed the following:   - Back pain: likely related to your muscles as well as stress. I am sending in a muscle relaxer that you can take up to 3 times per day. If you would like, you can cut the pill in half and try it out at night first to see if you like it. I am also giving you some exercises to try at home.   - Headaches: I am hopeful that the muscle relaxer helps somewhat with your pain. Continue the Ibuprofen/Tylenol as well. Some of these headaches are likely due to the amount of stress you have so this will be something we have to continue to work on.

## 2021-12-22 NOTE — Assessment & Plan Note (Signed)
High anxiety state secondary to children in home country not being safe.  Reports being able to sleep since she is taking the mirtazapine and that she is tolerating it well. - Mirtazapine prescription refilled

## 2022-01-27 ENCOUNTER — Other Ambulatory Visit: Payer: Self-pay | Admitting: Family Medicine

## 2022-01-27 DIAGNOSIS — F4320 Adjustment disorder, unspecified: Secondary | ICD-10-CM

## 2022-02-22 ENCOUNTER — Ambulatory Visit (INDEPENDENT_AMBULATORY_CARE_PROVIDER_SITE_OTHER): Payer: Medicaid Other | Admitting: Family Medicine

## 2022-02-22 ENCOUNTER — Encounter: Payer: Self-pay | Admitting: Family Medicine

## 2022-02-22 VITALS — BP 109/76 | HR 72 | Ht 63.0 in | Wt 151.2 lb

## 2022-02-22 DIAGNOSIS — M545 Low back pain, unspecified: Secondary | ICD-10-CM

## 2022-02-22 DIAGNOSIS — E785 Hyperlipidemia, unspecified: Secondary | ICD-10-CM

## 2022-02-22 MED ORDER — BACLOFEN 10 MG PO TABS: 10.0000 mg | ORAL_TABLET | Freq: Three times a day (TID) | ORAL | 0 refills | Status: DC

## 2022-02-22 NOTE — Patient Instructions (Signed)
I have sent in a prescription for the baclofen again. Please make sure to do the stretches and exercises while you are taking it to help improve your pain and movement.   We will check your cholesterol today.

## 2022-02-22 NOTE — Progress Notes (Signed)
    SUBJECTIVE:   CHIEF COMPLAINT / HPI:   In-person [place] interpreter present during entire visit.  Back pain - Feels improvement with baclofen - Has been doing stretches with only minimal improvement - Has had physical therapy in the past and does not wish to do this again - Still has a significant amount of stress contributing to her pain  PERTINENT  PMH / PSH: Reviewed  OBJECTIVE:   BP 109/76   Pulse 72   Ht 5\' 3"  (1.6 m)   Wt 151 lb 3.2 oz (68.6 kg)   SpO2 100%   BMI 26.78 kg/m   General: NAD, well-appearing, well-nourished Respiratory: No respiratory distress, breathing comfortably, able to speak in full sentences Skin: warm and dry, no rashes noted on exposed skin Psych: Appropriate affect and mood MSK: paraspinal muscles down entire spine and TTP, no obvious deformity noted. TTP in the trapezius and neck muscles as well as in the b/l SI region. Able to ambulate into and out of the room without assistance  ASSESSMENT/PLAN:   Lumbar back pain Continues to be complicated with significant stressors in her life. Given the improvement with Baclofen, we will do another course of this but patient instructed strictly to adhere to stretching and exercise regimen if we are to continue this medication. Does not wish to pursue PT as she did not feel like her previous experience with them for the same pain was helpful.  - Baclofen refilled - Patient has exercises and stretches assigned - Continue anxiety and stress management as able     , DO [facility]

## 2022-02-23 LAB — LIPID PANEL
Chol/HDL Ratio: 2.9 ratio (ref 0.0–4.4)
Cholesterol, Total: 172 mg/dL (ref 100–199)
HDL: 59 mg/dL (ref >39)
LDL Chol Calc (NIH): 100 mg/dL — ABNORMAL HIGH (ref 0–99)
Triglycerides: 71 mg/dL (ref 0–149)
VLDL Cholesterol Cal: 13 mg/dL (ref 5–40)

## 2022-02-23 NOTE — Assessment & Plan Note (Signed)
Continues to be complicated with significant stressors in her life. Given the improvement with Baclofen, we will do another course of this but patient instructed strictly to adhere to stretching and exercise regimen if we are to continue this medication. Does not wish to pursue PT as she did not feel like her previous experience with them for the same pain was helpful.  - Baclofen refilled - Patient has exercises and stretches assigned - Continue anxiety and stress management as able

## 2022-03-19 ENCOUNTER — Emergency Department (HOSPITAL_COMMUNITY): Payer: Medicaid Other

## 2022-03-19 ENCOUNTER — Emergency Department (HOSPITAL_COMMUNITY)
Admission: EM | Admit: 2022-03-19 | Discharge: 2022-03-19 | Disposition: A | Discharge status: Home / Self Care | Payer: Medicaid Other | Attending: Emergency Medicine | Admitting: Emergency Medicine

## 2022-03-19 ENCOUNTER — Other Ambulatory Visit: Payer: Self-pay

## 2022-03-19 ENCOUNTER — Encounter (HOSPITAL_COMMUNITY): Payer: Self-pay | Admitting: Pharmacy Technician

## 2022-03-19 DIAGNOSIS — R42 Dizziness and giddiness: Secondary | ICD-10-CM | POA: Diagnosis not present

## 2022-03-19 DIAGNOSIS — R0602 Shortness of breath: Secondary | ICD-10-CM | POA: Diagnosis not present

## 2022-03-19 DIAGNOSIS — R5383 Other fatigue: Secondary | ICD-10-CM | POA: Insufficient documentation

## 2022-03-19 DIAGNOSIS — R531 Weakness: Secondary | ICD-10-CM | POA: Diagnosis not present

## 2022-03-19 DIAGNOSIS — R200 Anesthesia of skin: Secondary | ICD-10-CM | POA: Insufficient documentation

## 2022-03-19 LAB — I-STAT BETA HCG BLOOD, ED (MC, WL, AP ONLY): I-stat hCG, quantitative: <5 m[IU]/mL (ref <5)

## 2022-03-19 LAB — BASIC METABOLIC PANEL
Anion gap: 10 (ref 5–15)
BUN: 17 mg/dL (ref 6–20)
CO2: 25 mmol/L (ref 22–32)
Calcium: 9.4 mg/dL (ref 8.9–10.3)
Chloride: 104 mmol/L (ref 98–111)
Creatinine, Ser: 0.73 mg/dL (ref 0.44–1.00)
GFR, Estimated: >60 mL/min (ref >60)
Glucose, Bld: 100 mg/dL — ABNORMAL HIGH (ref 70–99)
Potassium: 3.7 mmol/L (ref 3.5–5.1)
Sodium: 139 mmol/L (ref 135–145)

## 2022-03-19 LAB — CBC
HCT: 38.9 % (ref 36.0–46.0)
Hemoglobin: 13.1 g/dL (ref 12.0–15.0)
MCH: 29.4 pg (ref 26.0–34.0)
MCHC: 33.7 g/dL (ref 30.0–36.0)
MCV: 87.4 fL (ref 80.0–100.0)
Platelets: 193 10*3/uL (ref 150–400)
RBC: 4.45 MIL/uL (ref 3.87–5.11)
RDW: 12 % (ref 11.5–15.5)
WBC: 3.4 10*3/uL — ABNORMAL LOW (ref 4.0–10.5)
nRBC: 0 % (ref 0.0–0.2)

## 2022-03-19 LAB — CBG MONITORING, ED: Glucose-Capillary: 86 mg/dL (ref 70–99)

## 2022-03-19 LAB — URINALYSIS, ROUTINE W REFLEX MICROSCOPIC
Bilirubin Urine: NEGATIVE
Glucose, UA: NEGATIVE mg/dL
Hgb urine dipstick: NEGATIVE
Ketones, ur: NEGATIVE mg/dL
Leukocytes,Ua: NEGATIVE
Nitrite: NEGATIVE
Protein, ur: 30 mg/dL — AB
Specific Gravity, Urine: 1.025 (ref 1.005–1.030)
pH: 6.5 (ref 5.0–8.0)

## 2022-03-19 LAB — URINALYSIS, MICROSCOPIC (REFLEX)
RBC / HPF: NONE SEEN RBC/hpf (ref 0–5)
Squamous Epithelial / HPF: NONE SEEN /HPF (ref 0–5)

## 2022-03-19 MED ORDER — HYDROXYZINE HCL 10 MG PO TABS: 10.0000 mg | ORAL_TABLET | Freq: Three times a day (TID) | ORAL | 0 refills | Status: DC | PRN

## 2022-03-19 NOTE — ED Provider Notes (Signed)
[facility] EMERGENCY DEPARTMENT Provider Note   CSN: [id number] Arrival date & time: 03/19/22  1104     History  Chief Complaint  Patient presents with   Dizziness   Shortness of Breath    Jaydeen Darley is a 52 y.o. female.  The history is provided by the patient, a relative and medical records. The history is limited by a language barrier. A language interpreter was used.  Dizziness Associated symptoms: shortness of breath   Shortness of Breath    52 year old female from [place], history of chronic headache, latent TB, remote mini strokes according to family member presenting with complaints of weakness.  History obtained through son who is at bedside.  Over the past 2 to 3 days patient endorsed feeling fatigue, lightheadedness, dizzy, shortness of breath, tingling numbness throughout the entire body.  Symptoms persistent and moderate in severity.  No report of any fever runny nose sneezing coughing sore throat chest pain abdominal pain dysuria or rash.  She has had the symptoms in the past.  She is on multiple medication according to family member but no new medication.  Does not report any cold symptoms.  No treatment tried  Home Medications Prior to Admission medications   Medication Sig Start Date End Date Taking? Authorizing Provider  baclofen (LIORESAL) 10 MG tablet Take 1 tablet (10 mg total) by mouth 3 (three) times daily. 02/22/22   Lilland, Alana, DO  diclofenac Sodium (VOLTAREN) 1 % GEL Apply four times daily to affected area 08/19/20   Simmons-Robinson, Makiera, MD  hydrOXYzine (ATARAX) 10 MG tablet Take 1 tablet (10 mg total) by mouth 3 (three) times daily as needed. 07/04/21   Concepcion Living, MD  isoniazid (NYDRAZID) 300 MG tablet Take 300 mg by mouth daily.    [provider]  mirtazapine (REMERON) 7.5 MG tablet TAKE 1 TABLET(7.5 MG) BY MOUTH AT BEDTIME 01/29/22   Zola Button, MD  pyridOXINE (VITAMIN B-6) 25 MG tablet Take 25 mg by mouth  daily.    [provider]  rosuvastatin (CRESTOR) 10 MG tablet Take 1 tablet (10 mg total) by mouth daily. 12/22/21   Lilland, Alana, DO      Allergies    Paracetamol [acetaminophen]    Review of Systems   Review of Systems  Respiratory:  Positive for shortness of breath.   Neurological:  Positive for dizziness.  All other systems reviewed and are negative.   Physical Exam Updated Vital Signs BP 101/71 (BP Location: Left Arm)   Pulse 62   Temp 98.3 F (36.8 C) (Oral)   Resp 17   SpO2 99%  Physical Exam Vitals and nursing note reviewed.  Constitutional:      General: She is not in acute distress.    Appearance: She is well-developed.     Interventions: She is not intubated. HENT:     Head: Atraumatic.  Eyes:     Conjunctiva/sclera: Conjunctivae normal.  Cardiovascular:     Rate and Rhythm: Normal rate and regular rhythm.  Pulmonary:     Effort: Pulmonary effort is normal. No accessory muscle usage or respiratory distress. She is not intubated.     Breath sounds: No decreased breath sounds, wheezing, rhonchi or rales.  Abdominal:     Palpations: Abdomen is soft.     Tenderness: There is no abdominal tenderness.  Musculoskeletal:     Cervical back: Neck supple.     Right lower leg: No edema.     Left lower leg:  No edema.  Skin:    Findings: No rash.  Neurological:     Mental Status: She is alert. Mental status is at baseline.  Psychiatric:        Mood and Affect: Mood normal.     ED Results / Procedures / Treatments   Labs (all labs ordered are listed, but only abnormal results are displayed) Labs Reviewed  BASIC METABOLIC PANEL - Abnormal; Notable for the following components:      Result Value   Glucose, Bld 100 (*)    All other components within normal limits  CBC - Abnormal; Notable for the following components:   WBC 3.4 (*)    All other components within normal limits  URINALYSIS, ROUTINE W REFLEX MICROSCOPIC - Abnormal; Notable for the  following components:   Protein, ur 30 (*)    All other components within normal limits  URINALYSIS, MICROSCOPIC (REFLEX) - Abnormal; Notable for the following components:   Bacteria, UA RARE (*)    All other components within normal limits  CBG MONITORING, ED  I-STAT BETA HCG BLOOD, ED ([facility], [facility], [facility] ONLY)    EKG None ED ECG REPORT   Date: 03/19/2022  Rate: 73  Rhythm: normal sinus rhythm  QRS Axis: normal  Intervals: normal  ST/T Wave abnormalities: normal  Conduction Disutrbances:none  Narrative Interpretation:   Old EKG Reviewed: unchanged  I have personally reviewed the EKG tracing and agree with the computerized printout as noted.   Radiology DG Chest 2 View  Result Date: 03/19/2022 CLINICAL DATA:  Shortness of breath.  Weakness, dizziness. EXAM: CHEST - 2 VIEW COMPARISON:  10/01/2020 FINDINGS: The cardiomediastinal contours are normal. The lungs are clear. Pulmonary vasculature is normal. No consolidation, pleural effusion, or pneumothorax. No acute osseous abnormalities are seen. IMPRESSION: No acute chest findings. Electronically Signed   By: Keith Rake M.D.   On: 03/19/2022 11:46    Procedures Procedures    Medications Ordered in ED Medications - No data to display  ED Course/ Medical Decision Making/ A&P                           Medical Decision Making Amount and/or Complexity of Data Reviewed Labs: ordered. Radiology: ordered.   BP 101/71 (BP Location: Left Arm)   Pulse 62   Temp 98.3 F (36.8 C) (Oral)   Resp 17   SpO2 99%   67:18 PM  52 year old female from [place], history of chronic headache, latent TB, remote mini strokes according to family member presenting with complaints of weakness.  History obtained through son who is at bedside.  Over the past 2 to 3 days patient endorsed feeling fatigue, lightheadedness, dizzy, shortness of breath, tingling numbness throughout the entire body.  Symptoms persistent and moderate in severity.  No  report of any fever runny nose sneezing coughing sore throat chest pain abdominal pain dysuria or rash.  She has had the symptoms in the past.  She is on multiple medication according to family member but no new medication.  Does not report any cold symptoms.  No treatment tried  On exam patient appears to be in no acute discomfort.  Heart no with normal rate and rhythm, lungs clear to auscultation abdomen soft nontender she does not have any focal neurodeficit.  She ambulate without difficulty.  She does not appear to be in any acute respiratory discomfort.  All vital signs reviewed and unremarkable she is without fever or hypoxia.  She is not tachycardic.  She does not have any significant risk factors for PE, she does score low on the Wells criteria.  -Labs ordered, independently viewed and interpreted by me.  Labs remarkable for reassuring labs with normal electrolytes, normal H&H, normal CBG -The patient was maintained on a cardiac monitor.  I personally viewed and interpreted the cardiac monitored which showed an underlying rhythm of: NSR -Imaging independently viewed and interpreted by me and I agree with radiologist's interpretation.  Result remarkable for CXR unremarkable -This patient presents to the ED for concern of weakness, this involves an extensive number of treatment options, and is a complaint that carries with it a high risk of complications and morbidity.  The differential diagnosis includes anemia, hypoglycemia, metabolic derangement, PE, ACS, UTI -Co morbidities that complicate the patient evaluation includes prior stroke -Treatment includes none -Reevaluation of the patient after these medicines showed that the patient stayed the same -PCP office notes or outside notes reviewed -Escalation to admission/observation considered: patients feels much better, is comfortable with discharge, and will follow up with PCP -Prescription medication considered, patient comfortable with OTC  meds -Social Determinant of Health considered   1:09 PM Patient here with vague complaints which includes shortness of breath generalized fatigue and numbness.  This seems to increase within the past several days but has had similar symptoms in the past.  Does report that she has "mini strokes" without any residual deficit.  She also has been having quite a bit of stress in relating to her home country of [place] as well as with immigration.  On exam she does not have any focal neurodeficit.  Her vital signs are overall stable.  Labs obtained and are unremarkable.  Symptoms not consistent with stroke or PE.  No acute emergent medical condition identified.  At this time encouraged outpatient follow-up with her doctor for further care.  I will also prescribe Vistaril as needed for anxiety.  I recommend return if symptoms progress.  History was obtained through son at bedside who acts as interpreter per patient request.         Final Clinical Impression(s) / ED Diagnoses Final diagnoses:  Other fatigue    Rx / DC Orders ED Discharge Orders          Ordered    hydrOXYzine (ATARAX) 10 MG tablet  3 times daily PRN        03/19/22 1314              Domenic Moras, PA-C 03/19/22 1315    Maylon Peppers, [place], DO 03/19/22 1558

## 2022-03-19 NOTE — ED Triage Notes (Signed)
Pt here with reports of 2 days of dizziness, shob, numbness to whole body.

## 2022-03-19 NOTE — Discharge Instructions (Addendum)
You have been evaluated for your symptoms.  Fortunately x-ray EKG and your labs today are all looking normal.  Please follow-up with your doctor for further evaluation of your condition.  If you experiencing anxiety, you may take Atarax prescribed as needed.  Return if your symptoms worsen or if you have other concern.

## 2022-03-19 NOTE — ED Provider Triage Note (Signed)
Emergency Medicine Provider Triage Evaluation Note  Joyce Frye , a 52 y.o. female  was evaluated in triage.  Pt complains of weak. Report feeling sob, weak, dizzy, and numbness throughout body x 2-3 days.  No fever, chills, cough, congestion, n/v/d, dysuria, headache  Review of Systems  Positive: As above Negative: As above  Physical Exam  BP 112/81   Pulse 67   Temp 98.5 F (36.9 C) (Oral)   Resp 16   SpO2 98%  Gen:   Awake, no distress   Resp:  Normal effort  MSK:   Moves extremities without difficulty  Other:    Medical Decision Making  Medically screening exam initiated at 11:15 AM.  Appropriate orders placed.  Joyce Frye was informed that the remainder of the evaluation will be completed by another provider, this initial triage assessment does not replace that evaluation, and the importance of remaining in the ED until their evaluation is complete.     Domenic Moras, PA-C 03/19/22 1120

## 2022-03-23 IMAGING — CR DG CHEST 2V
2 series · 2 of 2 positions shown · non-contrast
Comparison: 04/07/2020

CLINICAL DATA: Short of breath

EXAM:
CHEST - 2 VIEW

[chest lat]
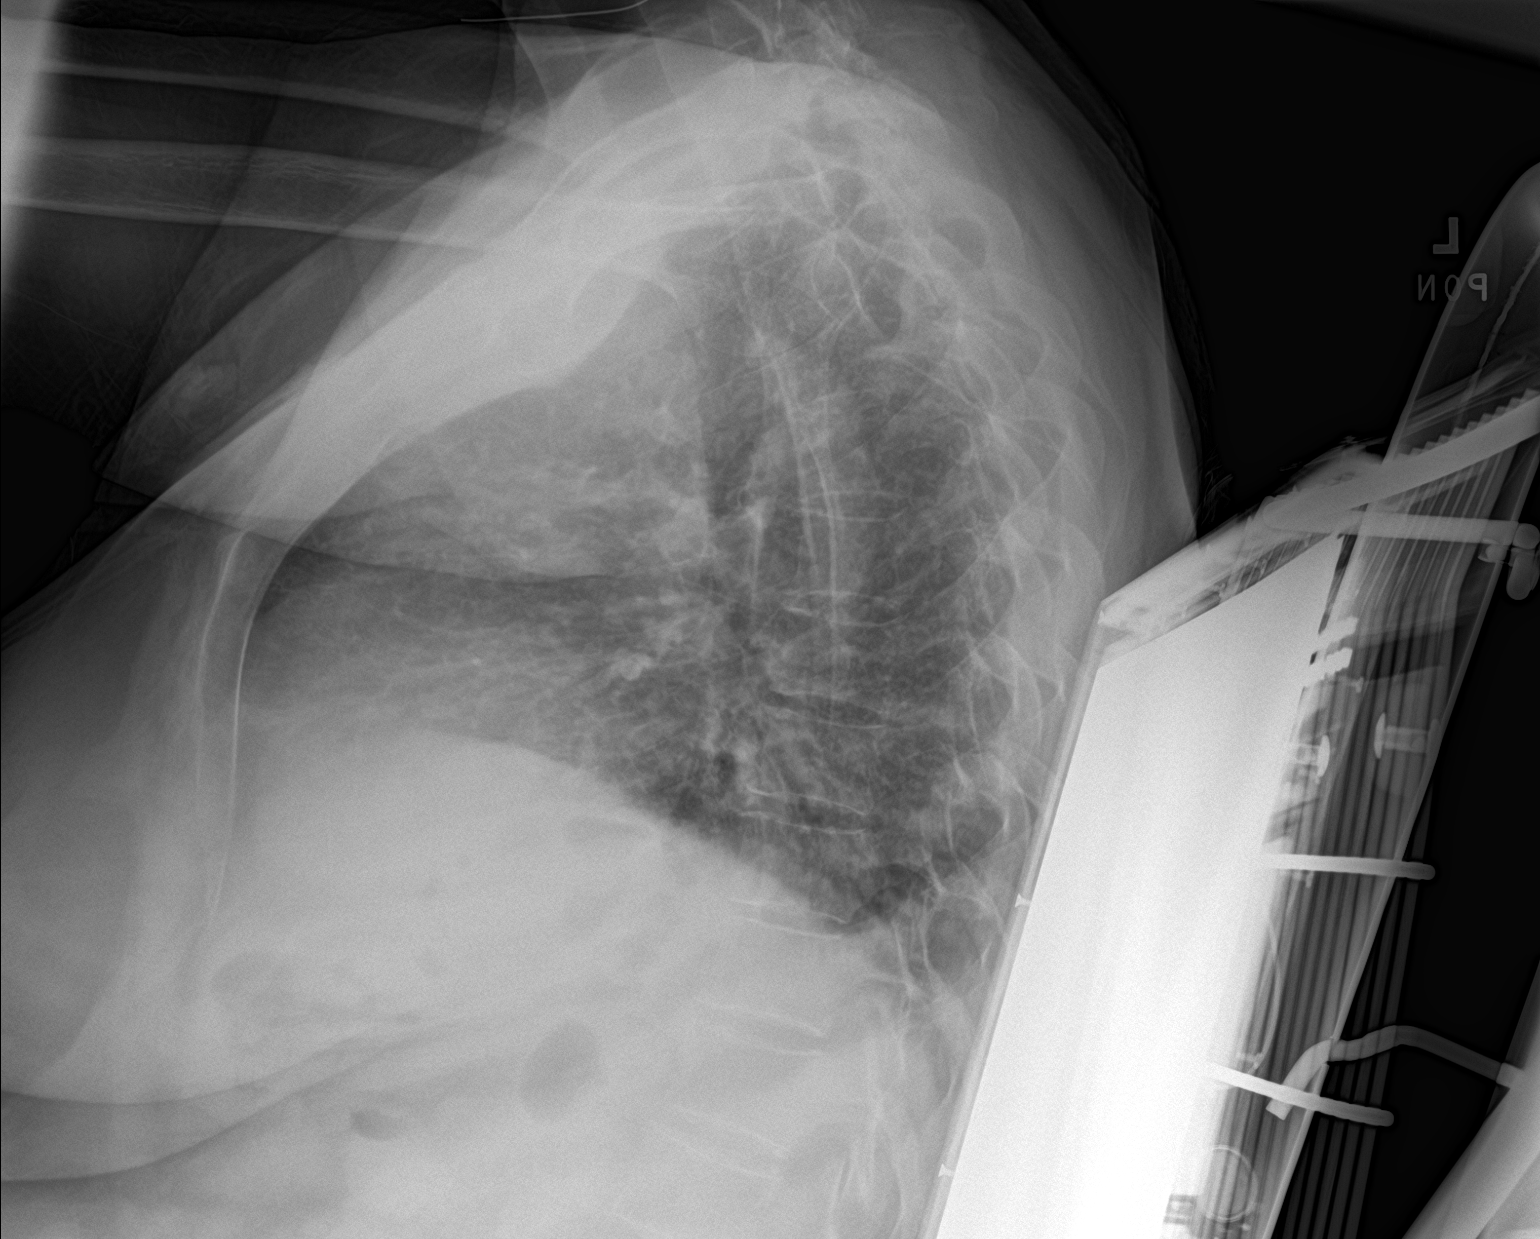

[chest ap]
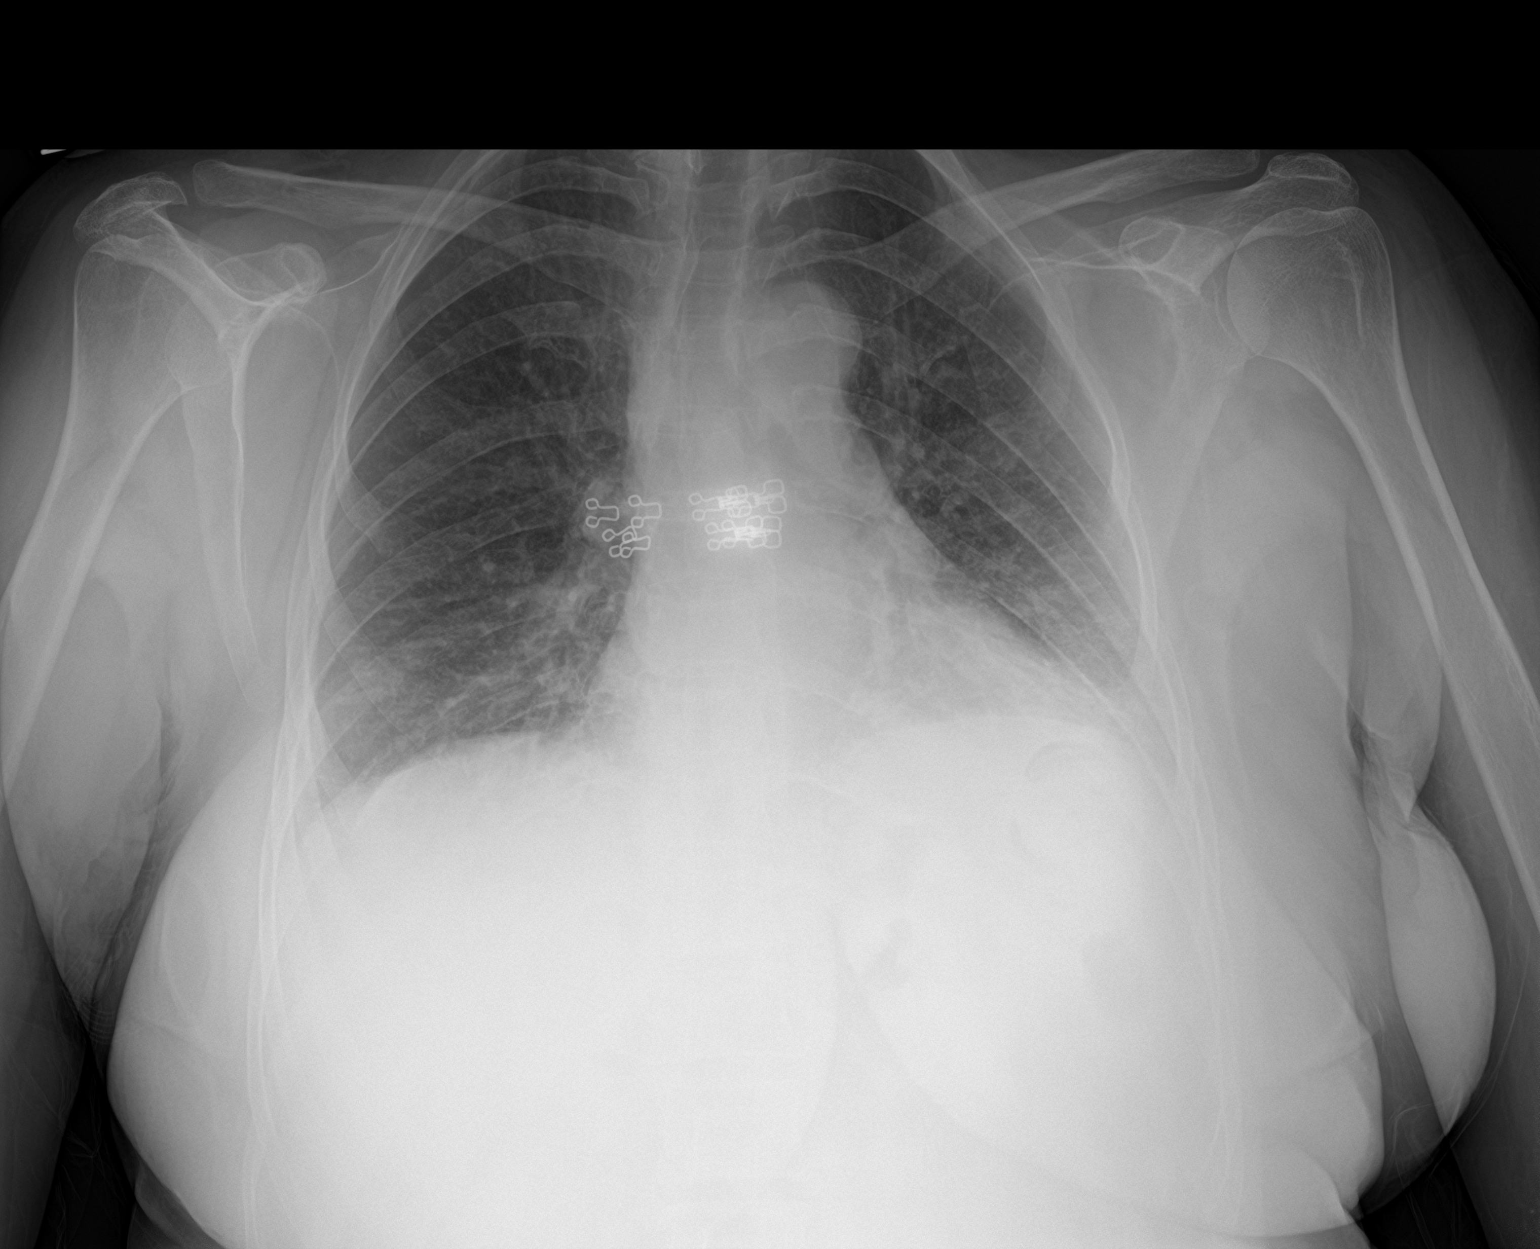

[2 of 2 positions shown; findings below may reference images not displayed]

FINDINGS: Stable cardiac silhouette. Mild basilar atelectasis increased from
prior. No effusion, infiltrate pneumothorax. No acute osseous
abnormality.
IMPRESSION: Mild basilar atelectasis.

## 2022-03-26 ENCOUNTER — Emergency Department (HOSPITAL_COMMUNITY)
Admission: EM | Admit: 2022-03-26 | Discharge: 2022-03-26 | Disposition: A | Discharge status: Home / Self Care | Payer: Medicaid Other | Attending: Emergency Medicine | Admitting: Emergency Medicine

## 2022-03-26 ENCOUNTER — Emergency Department (HOSPITAL_COMMUNITY): Payer: Medicaid Other

## 2022-03-26 ENCOUNTER — Other Ambulatory Visit: Payer: Self-pay

## 2022-03-26 DIAGNOSIS — Z20822 Contact with and (suspected) exposure to covid-19: Secondary | ICD-10-CM | POA: Diagnosis not present

## 2022-03-26 DIAGNOSIS — R197 Diarrhea, unspecified: Secondary | ICD-10-CM | POA: Diagnosis not present

## 2022-03-26 DIAGNOSIS — R1032 Left lower quadrant pain: Secondary | ICD-10-CM | POA: Diagnosis not present

## 2022-03-26 DIAGNOSIS — R1110 Vomiting, unspecified: Secondary | ICD-10-CM | POA: Diagnosis not present

## 2022-03-26 DIAGNOSIS — R7402 Elevation of levels of lactic acid dehydrogenase (LDH): Secondary | ICD-10-CM | POA: Insufficient documentation

## 2022-03-26 DIAGNOSIS — R112 Nausea with vomiting, unspecified: Secondary | ICD-10-CM | POA: Insufficient documentation

## 2022-03-26 DIAGNOSIS — E860 Dehydration: Secondary | ICD-10-CM | POA: Diagnosis not present

## 2022-03-26 DIAGNOSIS — R109 Unspecified abdominal pain: Secondary | ICD-10-CM | POA: Diagnosis not present

## 2022-03-26 DIAGNOSIS — I700 Atherosclerosis of aorta: Secondary | ICD-10-CM | POA: Diagnosis not present

## 2022-03-26 LAB — COMPREHENSIVE METABOLIC PANEL
ALT: 7 U/L (ref 0–44)
AST: 30 U/L (ref 15–41)
Albumin: 4.3 g/dL (ref 3.5–5.0)
Alkaline Phosphatase: 64 U/L (ref 38–126)
Anion gap: 10 (ref 5–15)
BUN: 21 mg/dL — ABNORMAL HIGH (ref 6–20)
CO2: 24 mmol/L (ref 22–32)
Calcium: 9.6 mg/dL (ref 8.9–10.3)
Chloride: 108 mmol/L (ref 98–111)
Creatinine, Ser: 0.72 mg/dL (ref 0.44–1.00)
GFR, Estimated: >60 mL/min (ref >60)
Glucose, Bld: 111 mg/dL — ABNORMAL HIGH (ref 70–99)
Potassium: 3.5 mmol/L (ref 3.5–5.1)
Sodium: 142 mmol/L (ref 135–145)
Total Bilirubin: 1.8 mg/dL — ABNORMAL HIGH (ref 0.3–1.2)
Total Protein: 7.9 g/dL (ref 6.5–8.1)

## 2022-03-26 LAB — URINALYSIS, MICROSCOPIC (REFLEX)

## 2022-03-26 LAB — URINALYSIS, ROUTINE W REFLEX MICROSCOPIC
Bilirubin Urine: NEGATIVE
Glucose, UA: NEGATIVE mg/dL
Ketones, ur: NEGATIVE mg/dL
Leukocytes,Ua: NEGATIVE
Nitrite: NEGATIVE
Protein, ur: NEGATIVE mg/dL
Specific Gravity, Urine: >1.03 — ABNORMAL HIGH (ref 1.005–1.030)
pH: 5.5 (ref 5.0–8.0)

## 2022-03-26 LAB — CBC
HCT: 41.9 % (ref 36.0–46.0)
Hemoglobin: 14.1 g/dL (ref 12.0–15.0)
MCH: 29.9 pg (ref 26.0–34.0)
MCHC: 33.7 g/dL (ref 30.0–36.0)
MCV: 89 fL (ref 80.0–100.0)
Platelets: 205 10*3/uL (ref 150–400)
RBC: 4.71 MIL/uL (ref 3.87–5.11)
RDW: 12.3 % (ref 11.5–15.5)
WBC: 6.1 10*3/uL (ref 4.0–10.5)
nRBC: 0 % (ref 0.0–0.2)

## 2022-03-26 LAB — LIPASE, BLOOD: Lipase: 42 U/L (ref 11–51)

## 2022-03-26 LAB — RESP PANEL BY RT-PCR (RSV, FLU A&B, COVID)  RVPGX2
Influenza A by PCR: NEGATIVE
Influenza B by PCR: NEGATIVE
Resp Syncytial Virus by PCR: NEGATIVE
SARS Coronavirus 2 by RT PCR: NEGATIVE

## 2022-03-26 LAB — LACTIC ACID, PLASMA: Lactic Acid, Venous: 2.1 mmol/L (ref 0.5–1.9)

## 2022-03-26 MED ORDER — FENTANYL CITRATE PF 50 MCG/ML IJ SOSY
50.0000 ug | PREFILLED_SYRINGE | Freq: Once | INTRAMUSCULAR | Status: AC
  Administered 2022-03-26: 50 ug via INTRAVENOUS
  Filled 2022-03-26 (×2): qty 1

## 2022-03-26 MED ORDER — IOHEXOL 350 MG/ML SOLN
75.0000 mL | Freq: Once | INTRAVENOUS | Status: AC | PRN
  Administered 2022-03-26: 75 mL via INTRAVENOUS

## 2022-03-26 MED ORDER — ONDANSETRON 4 MG PO TBDP: 4.0000 mg | ORAL_TABLET | Freq: Three times a day (TID) | ORAL | 0 refills | Status: DC | PRN

## 2022-03-26 MED ORDER — SODIUM CHLORIDE 0.9 % IV BOLUS
1000.0000 mL | Freq: Once | INTRAVENOUS | Status: AC
  Administered 2022-03-26: 1000 mL via INTRAVENOUS

## 2022-03-26 MED ORDER — ONDANSETRON HCL 4 MG/2ML IJ SOLN
4.0000 mg | Freq: Once | INTRAMUSCULAR | Status: AC
  Administered 2022-03-26: 4 mg via INTRAVENOUS
  Filled 2022-03-26: qty 2

## 2022-03-26 NOTE — ED Notes (Signed)
Pt was stuck twice. Was unsuccessful. Try again in the back.

## 2022-03-26 NOTE — ED Provider Notes (Signed)
[facility] EMERGENCY DEPARTMENT Provider Note   CSN: [id number] Arrival date & time: 03/26/22  0350     History  Chief Complaint  Patient presents with   Emesis    Joyce Frye is a 52 y.o. female.  The history is provided by the patient and medical records. The history is limited by a language barrier. A language interpreter was used (family interpreted).  Emesis Severity:  Severe Duration:  1 day Timing:  Intermittent Quality:  Stomach contents Progression:  Unchanged Chronicity:  New Recent urination:  Normal Relieved by:  Nothing Worsened by:  Nothing Ineffective treatments:  None tried Associated symptoms: abdominal pain and diarrhea   Associated symptoms: no chills, no cough, no fever and no headaches   Risk factors: no sick contacts and no suspect food intake (pizza lastnght shared with others)        Home Medications Prior to Admission medications   Medication Sig Start Date End Date Taking? Authorizing Provider  baclofen (LIORESAL) 10 MG tablet Take 1 tablet (10 mg total) by mouth 3 (three) times daily. 02/22/22   Lilland, Alana, DO  diclofenac Sodium (VOLTAREN) 1 % GEL Apply four times daily to affected area 08/19/20   Simmons-Robinson, Makiera, MD  hydrOXYzine (ATARAX) 10 MG tablet Take 1 tablet (10 mg total) by mouth 3 (three) times daily as needed for anxiety. 03/19/22   Domenic Moras, PA-C  isoniazid (NYDRAZID) 300 MG tablet Take 300 mg by mouth daily.    [provider]  mirtazapine (REMERON) 7.5 MG tablet TAKE 1 TABLET(7.5 MG) BY MOUTH AT BEDTIME 01/29/22   Zola Button, MD  pyridOXINE (VITAMIN B-6) 25 MG tablet Take 25 mg by mouth daily.    [provider]  rosuvastatin (CRESTOR) 10 MG tablet Take 1 tablet (10 mg total) by mouth daily. 12/22/21   Lilland, Alana, DO      Allergies    Paracetamol [acetaminophen]    Review of Systems   Review of Systems  Constitutional:  Positive for fatigue. Negative for chills,  diaphoresis and fever.  HENT:  Positive for congestion.   Respiratory:  Negative for cough, chest tightness, shortness of breath and wheezing.   Cardiovascular:  Negative for chest pain and palpitations.  Gastrointestinal:  Positive for abdominal pain, diarrhea, nausea and vomiting. Negative for abdominal distention and constipation.  Genitourinary:  Negative for dysuria and flank pain.  Musculoskeletal:  Negative for back pain.  Skin:  Negative for rash.  Neurological:  Negative for headaches.  Psychiatric/Behavioral:  Negative for agitation and confusion.   All other systems reviewed and are negative.   Physical Exam Updated Vital Signs BP 105/73   Pulse 85   Temp 97.8 F (36.6 C)   Resp 18   SpO2 97%  Physical Exam Vitals and nursing note reviewed.  Constitutional:      General: She is not in acute distress.    Appearance: She is well-developed. She is not ill-appearing, toxic-appearing or diaphoretic.  HENT:     Head: Normocephalic and atraumatic.     Mouth/Throat:     Mouth: Mucous membranes are dry.  Eyes:     Conjunctiva/sclera: Conjunctivae normal.     Pupils: Pupils are equal, round, and reactive to light.  Cardiovascular:     Rate and Rhythm: Normal rate and regular rhythm.     Heart sounds: No murmur heard. Pulmonary:     Effort: Pulmonary effort is normal. No respiratory distress.     Breath sounds: Normal breath  sounds. No wheezing, rhonchi or rales.  Chest:     Chest wall: No tenderness.  Abdominal:     General: Abdomen is flat.     Palpations: Abdomen is soft.     Tenderness: There is abdominal tenderness. There is no guarding or rebound.  Musculoskeletal:        General: No swelling or tenderness.     Cervical back: Neck supple.  Skin:    General: Skin is warm and dry.     Capillary Refill: Capillary refill takes less than 2 seconds.     Findings: No erythema.  Neurological:     Mental Status: She is alert.  Psychiatric:        Mood and Affect:  Mood normal.     ED Results / Procedures / Treatments   Labs (all labs ordered are listed, but only abnormal results are displayed) Labs Reviewed  COMPREHENSIVE METABOLIC PANEL - Abnormal; Notable for the following components:      Result Value   Glucose, Bld 111 (*)    BUN 21 (*)    Total Bilirubin 1.8 (*)    All other components within normal limits  URINALYSIS, ROUTINE W REFLEX MICROSCOPIC - Abnormal; Notable for the following components:   APPearance CLOUDY (*)    Specific Gravity, Urine >1.030 (*)    Hgb urine dipstick SMALL (*)    All other components within normal limits  URINALYSIS, MICROSCOPIC (REFLEX) - Abnormal; Notable for the following components:   Bacteria, UA MANY (*)    All other components within normal limits  LACTIC ACID, PLASMA - Abnormal; Notable for the following components:   Lactic Acid, Venous 2.1 (*)    All other components within normal limits  RESP PANEL BY RT-PCR (RSV, FLU A&B, COVID)  RVPGX2  LIPASE, BLOOD  CBC  LACTIC ACID, PLASMA    EKG None  Radiology CT ABDOMEN PELVIS W CONTRAST  Result Date: 03/26/2022 CLINICAL DATA:  Left lower quadrant abdominal pain. Nausea, vomiting, and diarrhea. EXAM: CT ABDOMEN AND PELVIS WITH CONTRAST TECHNIQUE: Multidetector CT imaging of the abdomen and pelvis was performed using the standard protocol following bolus administration of intravenous contrast. RADIATION DOSE REDUCTION: This exam was performed according to the departmental dose-optimization program which includes automated exposure control, adjustment of the mA and/or kV according to patient size and/or use of iterative reconstruction technique. CONTRAST:  37mL OMNIPAQUE IOHEXOL 350 MG/ML SOLN COMPARISON:  None Available. FINDINGS: Despite efforts by the technologist and patient, motion artifact is present on today's exam and could not be eliminated. This reduces exam sensitivity and specificity. Lower chest: Linear subsegmental atelectasis or scarring in  the posterior basal segment right lower lobe and in the right middle lobe. Hepatobiliary: Prior cholecystectomy. Small punctate calcifications inferiorly in the left hepatic lobe are likely postinflammatory. Pancreas: Unremarkable Spleen: Unremarkable Adrenals/Urinary Tract: Unremarkable Stomach/Bowel: There is some fluid-filled loops of nondilated small bowel, several containing loops of small bowel in a nonspecific pattern. Relatively empty distal colon. No substantial bowel wall thickening observed. Vascular/Lymphatic: Mild aortoiliac atherosclerotic vascular calcification. Reproductive: Unremarkable Other: No supplemental non-categorized findings. Musculoskeletal: Unremarkable IMPRESSION: 1. A specific cause for the patient's left lower quadrant abdominal pain is not identified. The distal half the colon is relatively empty aside for a small amount of fluid, and there are a few scattered nonspecific air-fluid levels in nondilated small bowel but no substantial bowel wall thickening, ascites, mesenteric edema, or dilated bowel. 2. Mild aortoiliac atherosclerotic vascular calcification. 3. Linear subsegmental atelectasis or  scarring in the right lower lobe and right middle lobe. 4. Aortic atherosclerosis. Aortic Atherosclerosis (ICD10-I70.0). Electronically Signed   By: Gaylyn Rong M.D.   On: 03/26/2022 15:23    Procedures Procedures    Medications Ordered in ED Medications  sodium chloride 0.9 % bolus 1,000 mL (0 mLs Intravenous Stopped 03/26/22 1539)  fentaNYL (SUBLIMAZE) injection 50 mcg (50 mcg Intravenous Given 03/26/22 1419)  ondansetron (ZOFRAN) injection 4 mg (4 mg Intravenous Given 03/26/22 1420)  iohexol (OMNIPAQUE) 350 MG/ML injection 75 mL (75 mLs Intravenous Contrast Given 03/26/22 1509)    ED Course/ Medical Decision Making/ A&P                           Medical Decision Making Amount and/or Complexity of Data Reviewed Labs: ordered. Radiology: ordered.  Risk Prescription drug  management.    Joyce Frye is a 52 y.o. female with a past medical history significant for previous TB, previous stroke, previous cholecystectomy, and dyslipidemia who presents with 1 day of nausea, vomiting, diarrhea, fatigue, and left-sided abdominal pain.  According to family, she had pizza last night with other family members and no one else has gotten sick but patient started having nausea and vomiting at about 11 PM last night.  Since then she has had numerous episodes of nausea and vomiting that was nonbloody and nonbilious.  She has had her gallbladder removed in the past.  She has had loose stools that has also not been bloody.  She denies any urinary changes but is feeling very fatigued and tired.  Of note she has been here for approximately 5 hours prior to manage evaluation.  Family said that she has otherwise done well this week aside from some abnormal symptoms with URI several days ago.  That has since improved.  Family reports no chest pain, shortness breath, or cough.  Patient has just had the nausea vomiting and diarrhea, and now moderate left-sided abdominal pain.  No history of diverticulitis reported.  On my exam, left abdomen is tender.  Bowel sounds are appreciated.  Back nontender.  Lungs were clear.  Chest nontender.  No murmur on my exam.  Mucous membranes do appear dry.  Patient laying in the hallway bed.  Clinically I am most concerned out a viral gastroenteritis or other viral illness however due to the left-sided tenderness and her persistent symptoms, we will get screening labs and imaging.  Also give her some fluid, nausea medicine, and pain medicine.  Anticipate reassessment after workup to determine disposition.  3:55 PM Patient's workup returned with some slight dehydration with elevated lactic acid 2.1.  COVID/flu/RSV negative.  Lipase normal.  Other labs overall reassuring.  No evidence of UTI.  CT scan returned without evidence of diverticulitis or significant  abnormality.  On reassessment, she is feeling much better after fluids and medicine and would like to go home.  Suspect viral gastroenteritis primarily.  She will follow-up with PCP and will give prescription for Zofran ODT.  Family agree to plan of care and patient discharged in good condition with improving symptoms.         Final Clinical Impression(s) / ED Diagnoses Final diagnoses:  Nausea vomiting and diarrhea  Dehydration  Abdominal cramping    Rx / DC Orders ED Discharge Orders          Ordered    ondansetron (ZOFRAN-ODT) 4 MG disintegrating tablet  Every 8 hours PRN  03/26/22 1601            Clinical Impression: 1. Nausea vomiting and diarrhea   2. Dehydration   3. Abdominal cramping     Disposition: Discharge  Condition: Good  I have discussed the results, Dx and Tx plan with the pt(& family if present). He/she/they expressed understanding and agree(s) with the plan. Discharge instructions discussed at great length. Strict return precautions discussed and pt &/or family have verbalized understanding of the instructions. No further questions at time of discharge.    New Prescriptions   ONDANSETRON (ZOFRAN-ODT) 4 MG DISINTEGRATING TABLET    Take 1 tablet (4 mg total) by mouth every 8 (eight) hours as needed for nausea or vomiting.    Follow Up: [place] [phone] Schedule an appointment as soon as possible for a visit    [place] [address] [id number] mc [place]       Hulen Mandler, Gwenyth Allegra, MD 03/26/22 [phone]

## 2022-03-26 NOTE — ED Notes (Signed)
Iv access attempted

## 2022-03-26 NOTE — Discharge Instructions (Signed)
Your history, exam, workup today are suspicious for a viral syndrome like gastroenteritis causing nausea vomiting, diarrhea, and abdominal cramping.  The rest of your labs showed some dehydration but were otherwise reassuring.  After medicines and fluids you are feeling better and the CT scan did not show acute diverticulitis.  We do feel you are now safe for discharge home.  Please rest and stay hydrated and use the nausea medicine cleared please follow-up with an outpatient doctor.  If any symptoms change or worsen acutely, please return to the nearest Emergency Department.

## 2022-03-26 NOTE — ED Triage Notes (Signed)
Patient complains of vomiting and diarrhea starting at 2300 yesterday. Denies abdominal pain. Patient is alert, oriented, and in no apparent distress at this time.

## 2022-04-21 ENCOUNTER — Ambulatory Visit (HOSPITAL_COMMUNITY)
Admission: EM | Admit: 2022-04-21 | Discharge: 2022-04-21 | Disposition: A | Discharge status: Home / Self Care | Payer: Medicaid Other | Attending: Internal Medicine | Admitting: Internal Medicine

## 2022-04-21 ENCOUNTER — Encounter (HOSPITAL_COMMUNITY): Payer: Self-pay | Admitting: Emergency Medicine

## 2022-04-21 DIAGNOSIS — R1084 Generalized abdominal pain: Secondary | ICD-10-CM

## 2022-04-21 LAB — POCT URINALYSIS DIPSTICK, ED / UC
Bilirubin Urine: NEGATIVE
Glucose, UA: NEGATIVE mg/dL
Hgb urine dipstick: NEGATIVE
Leukocytes,Ua: NEGATIVE
Nitrite: NEGATIVE
Protein, ur: NEGATIVE mg/dL
Specific Gravity, Urine: 1.015 (ref 1.005–1.030)
Urobilinogen, UA: 0.2 mg/dL (ref 0.0–1.0)
pH: 6 (ref 5.0–8.0)

## 2022-04-21 NOTE — ED Triage Notes (Signed)
2 days ago began having abdominal pain in various places, also reports dizziness and bloating. Hx of cholecystectomy 9 years prior. Reports last stool around 10 am this morning, slightly loose. Denies fever, N/V/D.  Son states the pain began after wearing pants that were very tight.  Attempted to use the interpreter service, but the interpreter was too difficult to understand for both the patient and the staff. Son is interpreting

## 2022-04-21 NOTE — Discharge Instructions (Signed)
Purchase and take Colace stool softener once daily to keep your stools soft so that you are able to poop normally.  You may purchase and take Gas-X as directed to help with abdominal pain as well.  You may purchase and take famotidine (Pepcid) once daily in the morning (20 mg) to help reduce stomach acid.  Call the gastroenterologist listed on your paperwork to schedule an appointment for follow-up for ongoing evaluation of your abdominal discomfort.  If you develop any new or worsening symptoms or do not improve in the next 2 to 3 days, please return.  If your symptoms are severe, please go to the emergency room.  Follow-up with your primary care provider for further evaluation and management of your symptoms as well as ongoing wellness visits.  I hope you feel better!

## 2022-04-21 NOTE — ED Provider Notes (Signed)
[place]    CSN: [id number] Arrival date & time: 04/21/22  1157      History   Chief Complaint Chief Complaint  Patient presents with   Abdominal Pain    HPI Joyce Frye is a 52 y.o. female.   Patient presents urgent care with her son who provides interpretation and contributes to the history for evaluation of abdominal bloating and generalized abdominal discomfort for the last 2 days after she "wore tight pants".  Patient was seen in the emergency room 3 weeks ago for nausea, vomiting, and abdominal pain where she was found to have a negative CT scan of the abdomen and pelvis.  She is postmenopausal.  She denies urinary symptoms and vaginal symptoms.  No flank pain, low back pain, dizziness, nausea, vomiting, diarrhea, or constipation reported.  She does have a history of constipation in the past.  Last normal bowel movement was this morning and soft.  No blood/mucus in the stools.  She has been passing gas normally.  She has experienced acid reflux symptoms over the last 2 to 3 days but denies recent intake of spicy or fatty foods.  No recent intake of foods outside of her normal diet.  She is drinking plenty of water and does not have frequent intake of urinary irritants.  He is not a smoker and denies drug use.  No fever, chills, or recent viral URI symptoms.  No recent antibiotics or steroid use.  She states usually when she wears tight pants, she will have pain to the abdomen but this goes away when she takes the pants off.  She has not attempted use of any over-the-counter medications to help with pain prior to arrival urgent care.  She has been suffering from abdominal pain problems for the last few months and has never been worked up by a gastroenterologist for symptoms.   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      Home Medications    Prior to Admission medications   Medication Sig Start Date End Date Taking? Authorizing Provider  baclofen (LIORESAL) 10 MG tablet Take 1 tablet (10 mg total) by mouth 3 (three) times daily. 02/22/22   Lilland, Alana, DO  diclofenac Sodium (VOLTAREN) 1 % GEL Apply four times daily to affected area 08/19/20   Simmons-Robinson, Makiera, MD  hydrOXYzine (ATARAX) 10 MG tablet Take 1 tablet (10 mg total) by mouth 3 (three) times daily as needed for anxiety. 03/19/22   Domenic Moras, PA-C  isoniazid (NYDRAZID) 300 MG tablet Take 300 mg by mouth daily.    [provider]  mirtazapine (REMERON) 7.5 MG tablet TAKE 1 TABLET(7.5 MG) BY MOUTH AT BEDTIME 01/29/22   Zola Button, MD  ondansetron (ZOFRAN-ODT) 4 MG disintegrating tablet Take 1 tablet (4 mg total) by mouth every 8 (eight) hours as needed for nausea or vomiting.  03/26/22   Tegeler, Gwenyth Allegra, MD  pyridOXINE (VITAMIN B-6) 25 MG tablet Take 25 mg by mouth daily.    [provider]  rosuvastatin (CRESTOR) 10 MG tablet Take 1 tablet (10 mg total) by mouth daily. 12/22/21   Rise Patience, DO    Family History History reviewed. No pertinent family history.  Social History Social History   Tobacco Use   Smoking status: Never   Smokeless tobacco: Never  Substance Use Topics   Alcohol use: Never   Drug use: Never     Allergies   Paracetamol [acetaminophen]   Review of Systems Review of Systems  Gastrointestinal:  Positive for abdominal pain.  Per HPI   Physical Exam Triage Vital Signs ED Triage  Vitals  Enc Vitals Group     BP 04/21/22 1425 120/78     Pulse Rate 04/21/22 1425 65     Resp 04/21/22 1425 16     Temp 04/21/22 1425 98.4 F (36.9 C)     Temp Source 04/21/22 1425 Oral     SpO2 04/21/22 1425 98 %     Weight --      Height --      Head Circumference --      Peak Flow --      Pain Score 04/21/22 1424 10     Pain Loc --      Pain Edu? --      Excl. in [place]? --    No data found.  Updated Vital Signs BP 120/78 (BP Location: Left Arm)   Pulse 65   Temp 98.4 F (36.9 C) (Oral)   Resp 16   SpO2 98%   Visual Acuity Right Eye Distance:   Left Eye Distance:   Bilateral Distance:    Right Eye Near:   Left Eye Near:    Bilateral Near:     Physical Exam Vitals and nursing note reviewed.  Constitutional:      Appearance: She is not ill-appearing or toxic-appearing.  HENT:     Head: Normocephalic and atraumatic.     Right Ear: Hearing and external ear normal.     Left Ear: Hearing and external ear normal.     Nose: Nose normal.     Mouth/Throat:     Lips: Pink.  Eyes:     General: Lids are normal. Vision grossly intact. Gaze aligned appropriately.     Extraocular Movements: Extraocular movements intact.     Conjunctiva/sclera: Conjunctivae normal.  Cardiovascular:     Rate and Rhythm: Normal rate and regular rhythm.     Heart sounds: Normal heart sounds, S1 normal and S2 normal.  Pulmonary:     Effort: Pulmonary effort is normal. No respiratory distress.     Breath sounds: Normal breath sounds and air entry.  Abdominal:     General: Abdomen is flat. Bowel sounds are normal. There is no distension. There are no signs of injury.     Palpations: Abdomen is soft. There is no shifting dullness, fluid wave, splenomegaly, mass or pulsatile mass.     Tenderness: There is generalized abdominal tenderness and tenderness in the epigastric area, periumbilical area and suprapubic area. There is no right CVA tenderness, left CVA tenderness, guarding or rebound. Negative  signs include Murphy's sign and McBurney's sign.     Hernia: No hernia is present.  Musculoskeletal:     Cervical back: Neck supple.  Skin:    General: Skin is warm and dry.     Capillary  Refill: Capillary refill takes less than 2 seconds.     Findings: No rash.  Neurological:     General: No focal deficit present.     Mental Status: She is alert and oriented to person, place, and time. Mental status is at baseline.     Cranial Nerves: No dysarthria or facial asymmetry.  Psychiatric:        Mood and Affect: Mood normal.        Speech: Speech normal.        Behavior: Behavior normal.        Thought Content: Thought content normal.        Judgment: Judgment normal.      UC Treatments / Results  Labs (all labs ordered are listed, but only abnormal results are displayed) Labs Reviewed  POCT URINALYSIS DIPSTICK, ED / UC - Abnormal; Notable for the following components:      Result Value   Ketones, ur TRACE (*)    All other components within normal limits    EKG   Radiology No results found.  Procedures Procedures (including critical care time)  Medications Ordered in UC Medications - No data to display  Initial Impression / Assessment and Plan / UC Course  I have reviewed the triage vital signs and the nursing notes.  Pertinent labs & imaging results that were available during my care of the patient were reviewed by me and considered in my medical decision making (see chart for details).   1.  Generalized abdominal pain Unclear etiology of patient's discomfort, although CT abdomen pelvis performed in the emergency department 3 weeks ago was negative for intra-abdominal abnormality.  Reviewed lab work results from recent ER visit as well, these results are normal.  Abdominal exam today is without peritoneal signs or concern for possible surgical abdomen.  Suspect patient's discomfort may be related to incomplete bowel emptying due to history of constipation.  She is to  start taking Colace stool softener once daily to help keep stools soft and advised increase fiber intake/water intake as well.  She may purchase Gas-X over-the-counter to help with abdominal pain as well as I believe the type of pain she is describing could be related to gas pain.  She is tender to her epigastric region of her abdomen and has had recent acid reflux, therefore I would like for her to start taking Pepcid once daily in the morning 20 mg to help with acid reflux and reduce stomach acid.    Vital signs are hemodynamically stable.  Patient is nontoxic in appearance.  Walking referral to gastroenterology provided.  Advised son to call on Monday morning to schedule an appointment for soon as possible for further evaluation and workup as well as ongoing management of abdominal pain.  GERD precautions discussed.  Patient and son are agreeable with this plan.  Discussed physical exam and available lab work findings in clinic with patient.  Counseled patient regarding appropriate use of medications and potential side effects for all medications recommended or prescribed today. Discussed red flag signs and symptoms of worsening condition,when to call the PCP office, return to urgent care, and when to seek higher level of care in the emergency department. Patient verbalizes understanding and agreement with plan. All questions answered. Patient discharged in stable condition.   Final Clinical Impressions(s) / UC Diagnoses   Final diagnoses:  Generalized abdominal pain     Discharge Instructions      Purchase and take Colace stool softener once daily to  keep your stools soft so that you are able to poop normally.  You may purchase and take Gas-X as directed to help with abdominal pain as well.  You may purchase and take famotidine (Pepcid) once daily in the morning (20 mg) to help reduce stomach acid.  Call the gastroenterologist listed on your paperwork to schedule an appointment for  follow-up for ongoing evaluation of your abdominal discomfort.  If you develop any new or worsening symptoms or do not improve in the next 2 to 3 days, please return.  If your symptoms are severe, please go to the emergency room.  Follow-up with your primary care provider for further evaluation and management of your symptoms as well as ongoing wellness visits.  I hope you feel better!   ED Prescriptions   None    PDMP not reviewed this encounter.   Carlisle Beers, [place] 04/21/22 [phone]

## 2022-04-27 ENCOUNTER — Encounter: Payer: Self-pay | Admitting: Family Medicine

## 2022-04-27 ENCOUNTER — Other Ambulatory Visit: Payer: Self-pay | Admitting: Family Medicine

## 2022-04-27 ENCOUNTER — Ambulatory Visit: Payer: Medicaid Other | Admitting: Family Medicine

## 2022-04-27 VITALS — BP 99/75 | HR 79 | Ht 63.0 in | Wt 148.6 lb

## 2022-04-27 DIAGNOSIS — R1084 Generalized abdominal pain: Secondary | ICD-10-CM

## 2022-04-27 DIAGNOSIS — M545 Low back pain, unspecified: Secondary | ICD-10-CM

## 2022-04-27 DIAGNOSIS — H6121 Impacted cerumen, right ear: Secondary | ICD-10-CM

## 2022-04-27 MED ORDER — FAMOTIDINE 20 MG PO TABS: 20.0000 mg | ORAL_TABLET | Freq: Two times a day (BID) | ORAL | 1 refills | Status: DC

## 2022-04-27 MED ORDER — POLYETHYLENE GLYCOL 3350 17 GM/SCOOP PO POWD: 17.0000 g | Freq: Every day | ORAL | 0 refills | Status: DC

## 2022-04-27 MED ORDER — ROSUVASTATIN CALCIUM 10 MG PO TABS: 10.0000 mg | ORAL_TABLET | Freq: Every day | ORAL | 3 refills | Status: DC

## 2022-04-27 NOTE — Progress Notes (Signed)
    SUBJECTIVE:   CHIEF COMPLAINT / HPI:   *In-person [place] interpreter present during entire encounter*  Back pain follow-up - Patient still does not want to pursue PT at this time - Denies red flag symptoms - Is more concerned about her abdominal pain than back pain  Abdominal pain - Has been seen in the ER twice this year for GI related symptoms already - for the first episode, went to the ER and was given medications with some improvement - Most recent episode started about 1 week ago and went to an urgent care and was prescribed pepcid - Reports that the pain is constant and not related to food - Has a history of constipation and is taking colace and  - Tried pants but had pain and went to urgent care and was given Pepcid, which she is taking - constant pain not related to food - has constipation currently, taking colace   Lightheadedness - Will happen when she is sitting (but not with specific movements) - Denies dizziness with standing or position changes - hx of cerumen impactions per sponsor and had similar symptoms at that time  PERTINENT  PMH / PSH: Reviewed  OBJECTIVE:   BP 99/75   Pulse 79   Ht 5\' 3"  (1.6 m)   Wt 148 lb 9.6 oz (67.4 kg)   SpO2 97%   BMI 26.32 kg/m   Gen: well-appearing, NAD HEENT: L TM clear, R TM not visualized due to cerumen impaction CV: RRR, no m/r/g appreciated, no peripheral edema Pulm: CTAB, no wheezes/crackles GI: soft, diffusely TTP with worse pain around the umbilicus, non-distended, no hepatomegaly  ASSESSMENT/PLAN:   Abdominal pain, generalized Has been evaluated on 1/8 and 2/3 of this year for similar symptoms. Had labs completed without obvious cause and negative for UTI so do not feel that repeating these tests is necessary as symptoms have not changed. CT abdomen/pelvis done on 1/8 and was negative for acute findings, do not feel that symptoms at this time would warrant imaging unless they were worsening. Has had mild  improvement with pepcid. Given the persistence of symptoms and patient's concern and age, will refer to GI and ensure treatment for easy causes such as GERD and constipation are completed. Has hx of cholecystectomy and do not feel that is contributing, symptoms not consistent with kidney stones or UTI, low concern for appendicitis. - Referral to GI placed, patient also due for colonoscopy - Continue Pepcid but increase to 20mg  BID - Continue simethicone - Recommend Miralax instead of colace daily for the next 1-2 weeks - Discussed strict return and ER precautions if worsening pain or systemic symptoms  Back pain Patient preferred to discuss abdominal pain, though the appointment was initially for back pain follow-up. Discussed with patient that her doing exercises at home is likely not enough and would recommend formal PT as she will not be pushing herself to the level needed for improvement and she could be doing exercises that are not appropriate or not geared to improve her condition.  Cerumen impaction  Lightheadedness Patient's description of symptoms is not consistent with orthostatic hypotension. More consistent with either abnormal signals from impaction affecting her balance or consideration for BPPV (though description is still not consistent) - Patient to follow-up with ENT, they are already established   Rise Patience, [place]

## 2022-04-27 NOTE — Patient Instructions (Signed)
I am putting in a referral to the GI doctor to check about your abdominal pain.  I want you to take the Pepcid (famotidine) twice daily.  Continue the Gas-X if that is helping.  I would recommend taking MiraLAX instead of the Colace.  I want you to call your ENT doctor to schedule an appointment for getting your ears cleaned to see if that helps the dizziness, if it does not I would also mention that to them to see if they have any other recommendations.  For the back pain, I still do recommend physical therapy, this is something you would like to initiate then let me know.

## 2022-04-28 ENCOUNTER — Other Ambulatory Visit: Payer: Self-pay | Admitting: Family Medicine

## 2022-04-28 DIAGNOSIS — F4320 Adjustment disorder, unspecified: Secondary | ICD-10-CM

## 2022-05-25 ENCOUNTER — Encounter: Payer: Self-pay | Admitting: Gastroenterology

## 2022-06-26 ENCOUNTER — Telehealth: Payer: Self-pay | Admitting: Family Medicine

## 2022-06-26 NOTE — Telephone Encounter (Signed)
Patient's son dropped off medical clearance form to be completed. Last DOS was 04/27/22. Placed in [employer].

## 2022-06-27 NOTE — Telephone Encounter (Signed)
Form completed placed in RN box 

## 2022-06-27 NOTE — Telephone Encounter (Signed)
Clinical info completed on dental form.  Placed form in PCP's box for completion.    When form is completed, please route note to "RN Team" and place in wall pocket in front office.   Aquilla Solian, CMA

## 2022-06-28 NOTE — Telephone Encounter (Signed)
Dental clearance placed up front for pick up.   Copy made for batch scanning.   Patients son aware.

## 2022-07-23 ENCOUNTER — Other Ambulatory Visit: Payer: Self-pay

## 2022-07-23 MED ORDER — POLYETHYLENE GLYCOL 3350 17 GM/SCOOP PO POWD: 17.0000 g | Freq: Every day | ORAL | 0 refills | Status: DC

## 2022-07-30 ENCOUNTER — Encounter: Payer: Self-pay | Admitting: Gastroenterology

## 2022-07-30 ENCOUNTER — Other Ambulatory Visit: Payer: Self-pay

## 2022-07-30 ENCOUNTER — Ambulatory Visit: Payer: Medicaid Other | Admitting: Gastroenterology

## 2022-07-30 ENCOUNTER — Other Ambulatory Visit: Payer: Medicaid Other

## 2022-07-30 VITALS — BP 90/62 | HR 68 | Ht 63.0 in | Wt 148.5 lb

## 2022-07-30 DIAGNOSIS — R1013 Epigastric pain: Secondary | ICD-10-CM | POA: Diagnosis not present

## 2022-07-30 DIAGNOSIS — Z1211 Encounter for screening for malignant neoplasm of colon: Secondary | ICD-10-CM

## 2022-07-30 DIAGNOSIS — K5900 Constipation, unspecified: Secondary | ICD-10-CM

## 2022-07-30 NOTE — Progress Notes (Signed)
HPI : Joyce Frye is a very pleasant 52 year old female with a history of chronic headaches and anxiety who is referred to [place] by Dr. Littie Deeds for further evaluation and management of persistent abdominal pain.  The patient speaks very little English, and so is accompanied by a Orthoptist in clinic today.  The patient states that she has been dealing with this pain for about 2 months now.  Pain is located in the upper abdomen and is variably described as a "numbness" but also sometimes a sharp stabbing sensation.  She was started on Pepcid, which has helped decrease the severity of the pain.  The pain does not vary with food.  She denies any symptoms of nausea or vomiting.  No GERD symptoms such as heartburn or acid regurgitation.  No dysphagia. She does struggle with chronic constipation, manifested by small, hard stools which are difficult to pass.  She does have a bowel movement most every day.  Diarrhea is not a problem for her. No weight loss, rather the patient's weight has increased.  She is prescribed MiraLAX, but admits that she usually forgets to take it.  The patient has been to the emergency department twice for GI complaints this year, most recently in February.  A CT of the abdomen and pelvis was unremarkable in January.  Basic labs (CBC, CMP, UA) were unremarkable except for an isolated elevated bilirubin (1.8).  She had a cholecystectomy in [place] 8-10 years ago;  She has never undergone any colon cancer screening or endoscopy.   CT abdomen/pelvis with contrast Mar 26, 2022 IMPRESSION: 1. A specific cause for the patient's left lower quadrant abdominal pain is not identified. The distal half the colon is relatively empty aside for a small amount of fluid, and there are a few scattered nonspecific air-fluid levels in nondilated small bowel but no substantial bowel wall thickening, ascites, mesenteric edema, or dilated bowel. 2. Mild aortoiliac atherosclerotic  vascular calcification. 3. Linear subsegmental atelectasis or scarring in the right lower lobe and right middle lobe. 4. Aortic atherosclerosis  Past Medical History:  Diagnosis Date   Cerebrovascular disease    Chronic headache    Strabismus    Stroke (HCC)    TB lung, latent      Past Surgical History:  Procedure Laterality Date   CHOLECYSTECTOMY     No family history on file. Social History   Tobacco Use   Smoking status: Never   Smokeless tobacco: Never  Substance Use Topics   Alcohol use: Never   Drug use: Never   Current Outpatient Medications  Medication Sig Dispense Refill   baclofen (LIORESAL) 10 MG tablet Take 1 tablet (10 mg total) by mouth 3 (three) times daily. 90 each 0   diclofenac Sodium (VOLTAREN) 1 % GEL Apply four times daily to affected area 100 g 3   famotidine (PEPCID) 20 MG tablet TAKE 1 TABLET(20 MG) BY MOUTH TWICE DAILY 180 tablet 2   hydrOXYzine (ATARAX) 10 MG tablet Take 1 tablet (10 mg total) by mouth 3 (three) times daily as needed for anxiety. 30 tablet 0   isoniazid (NYDRAZID) 300 MG tablet Take 300 mg by mouth daily.     mirtazapine (REMERON) 7.5 MG tablet TAKE 1 TABLET(7.5 MG) BY MOUTH AT BEDTIME 90 tablet 1   ondansetron (ZOFRAN-ODT) 4 MG disintegrating tablet Take 1 tablet (4 mg total) by mouth every 8 (eight) hours as needed for nausea or vomiting. 20 tablet 0   polyethylene glycol powder (GLYCOLAX/MIRALAX)  17 GM/SCOOP powder Take 17 g by mouth daily. 500 g 0   pyridOXINE (VITAMIN B-6) 25 MG tablet Take 25 mg by mouth daily.     rosuvastatin (CRESTOR) 10 MG tablet Take 1 tablet (10 mg total) by mouth daily. 90 tablet 3   No current facility-administered medications for this visit.   Allergies  Allergen Reactions   Paracetamol [Acetaminophen]     Tremors      Review of Systems: All systems reviewed and negative except where noted in HPI.    No results found.  Physical Exam: BP 90/62 (BP Location: Left Arm, Patient Position:  Sitting, Cuff Size: Normal)   Pulse 68   Ht 5\' 3"  (1.6 m)   Wt 148 lb 8 oz (67.4 kg)   BMI 26.31 kg/m  Constitutional: Pleasant,well-developed, [place] female in no acute distress.  Accompanied by medical translator HEENT: Normocephalic and atraumatic. Conjunctivae are normal. No scleral icterus. Neck supple.  Cardiovascular: Normal rate, regular rhythm.  Pulmonary/chest: Effort normal and breath sounds normal. No wheezing, rales or rhonchi. Abdominal: Soft, nondistended, mild tenderness to palpation in the epigastrium, without rigidity or guarding. Bowel sounds active throughout. There are no masses palpable. No hepatomegaly. Extremities: no edema Neurological: Alert and oriented to person place and time. Skin: Skin is warm and dry. No rashes noted. Psychiatric: Normal mood and affect. Behavior is normal.  CBC    Component Value Date/Time   WBC 6.1 03/26/2022 0715   RBC 4.71 03/26/2022 0715   HGB 14.1 03/26/2022 0715   HGB 13.5 04/20/2020 0928   HCT 41.9 03/26/2022 0715   HCT 40.3 04/20/2020 0928   PLT 205 03/26/2022 0715   PLT 271 04/20/2020 0928   MCV 89.0 03/26/2022 0715   MCV 87 04/20/2020 0928   MCH 29.9 03/26/2022 0715   MCHC 33.7 03/26/2022 0715   RDW 12.3 03/26/2022 0715   RDW 12.6 04/20/2020 0928   LYMPHSABS 2.3 10/01/2020 0712   LYMPHSABS 2.0 04/20/2020 0928   MONOABS 0.3 10/01/2020 0712   EOSABS 0.0 10/01/2020 0712   EOSABS 0.1 04/20/2020 0928   BASOSABS 0.0 10/01/2020 0712   BASOSABS 0.0 04/20/2020 0928    CMP     Component Value Date/Time   NA 142 03/26/2022 0715   NA 142 04/20/2020 0928   K 3.5 03/26/2022 0715   CL 108 03/26/2022 0715   CO2 24 03/26/2022 0715   GLUCOSE 111 (H) 03/26/2022 0715   BUN 21 (H) 03/26/2022 0715   BUN 16 04/20/2020 0928   CREATININE 0.72 03/26/2022 0715   CALCIUM 9.6 03/26/2022 0715   PROT 7.9 03/26/2022 0715   PROT 8.1 04/20/2020 0928   ALBUMIN 4.3 03/26/2022 0715   ALBUMIN 4.7 04/20/2020 0928   AST 30 03/26/2022  0715   ALT 7 03/26/2022 0715   ALKPHOS 64 03/26/2022 0715   BILITOT 1.8 (H) 03/26/2022 0715   BILITOT 0.4 04/20/2020 0928   GFRNONAA >60 03/26/2022 0715   GFRAA 117 04/20/2020 0928     ASSESSMENT AND PLAN: 52 year old female with several months of upper abdominal pain.  No GERD symptoms and no nausea or vomiting.  No weight loss.  She also has chronic constipation.  She also struggles with stress and anxiety, related to her refugee status and separation from family.  Will check for H. pylori with a stool test.  However, if this test is negative, and her pain does not improve, would recommend an upper endoscopy to rule out malignancy. We discussed colon cancer screening, and  the patient was not interested in a colonoscopy, however she did agree to a stool based test.  The patient was informed that if the stool test was positive, a colonoscopy would be indicated. For her constipation, this may be contributing to her abdominal pain.  I recommend that she be more diligent about taking the MiraLAX every day, and increase her consumption of fiber through fruits, vegetables and whole grains as well as increase her water intake.   Abdominal pain - H. pylori stool antigen -EGD if H. pylori antigen negative  Colon cancer screening  -Cologuard  Constipation - MiraLax - Dietary fiber/increase water  Follow-up in 8 weeks if still having symptoms  Chanee Henrickson E. Tomasa Rand, MD [facility]     Littie Deeds, MD

## 2022-07-30 NOTE — Patient Instructions (Signed)
_______________________________________________________  If your blood pressure at your visit was 140/90 or greater, please contact your primary care physician to follow up on this.  _______________________________________________________  If you are age 53 or older, your body mass index should be between 23-30. Your Body mass index is 26.31 kg/m. If this is out of the aforementioned range listed, please consider follow up with your Primary Care Provider.  If you are age 40 or younger, your body mass index should be between 19-25. Your Body mass index is 26.31 kg/m. If this is out of the aformentioned range listed, please consider follow up with your Primary Care Provider.   ________________________________________________________  The [place] [facility] providers would like to encourage you to use [facility] to communicate with providers for non-urgent requests or questions.  Due to long hold times on the telephone, sending your provider a message by [facility] may be a faster and more efficient way to get a response.  Please allow 48 business hours for a response.  Please remember that this is for non-urgent requests.  _______________________________________________________  Your provider has requested that you go to the basement level for lab work before leaving today. Press "B" on the elevator. The lab is located at the first door on the left as you exit the elevator.  Your provider has ordered Cologuard testing as an option for colon cancer screening. This is performed by [employer] and may be out of network with your insurance. PRIOR to completing the test, it is YOUR responsibility to contact your insurance about covered benefits for this test. Your out of pocket expense could be anywhere from $0.00 to $649.00.   When you call to check coverage with your insurer, please provide the following information:   -The ONLY provider of Cologuard is [facility], dispensing  - CPT code for  Cologuard is [phone].  [employer] NPI # [id number]  -[employer] Tax ID # [id number]   We have already sent your demographic and insurance information to [employer] (phone number [phone]) and they should contact you within the next week regarding your test. If you have not heard from them within the next week, please call our office at [phone].  Take Miralax daily and increase water and fiber

## 2022-08-01 ENCOUNTER — Other Ambulatory Visit: Payer: Medicaid Other

## 2022-08-01 DIAGNOSIS — R1013 Epigastric pain: Secondary | ICD-10-CM

## 2022-08-03 LAB — H. PYLORI ANTIGEN, STOOL: H pylori Ag, Stl: NEGATIVE

## 2022-08-09 ENCOUNTER — Other Ambulatory Visit: Payer: Self-pay

## 2022-08-09 MED ORDER — POLYETHYLENE GLYCOL 3350 17 GM/SCOOP PO POWD: 17.0000 g | Freq: Every day | ORAL | 0 refills | Status: DC

## 2022-08-09 NOTE — Progress Notes (Signed)
Ms. Langley,  Your H. Pylori test was negative.  Please continue to take MiraLax and increase dietary fiber/water intake.  Hopefully improving your constipation will help your abdominal pain.  Follow up in 8 weeks and if your pain is not improved, I would recommend an upper endoscopy.   Awaiting Cologuard results.  Bonita Quin,  Can you please relay the above message?

## 2022-08-20 DIAGNOSIS — Z1211 Encounter for screening for malignant neoplasm of colon: Secondary | ICD-10-CM | POA: Diagnosis not present

## 2022-08-25 LAB — COLOGUARD: COLOGUARD: NEGATIVE

## 2022-08-29 ENCOUNTER — Telehealth: Payer: Self-pay | Admitting: Gastroenterology

## 2022-08-29 NOTE — Progress Notes (Signed)
Ms. Vancamp,  Your Cologuard test was negative.  You should repeat a colon cancer screening test again in 3 years.  Bonita Quin,  Please relay the above message, thanks

## 2022-08-29 NOTE — Telephone Encounter (Signed)
See result note.  

## 2022-08-29 NOTE — Telephone Encounter (Signed)
Patient's sponsor is returning Linda's call.

## 2022-10-04 DIAGNOSIS — Z23 Encounter for immunization: Secondary | ICD-10-CM | POA: Diagnosis not present

## 2022-10-12 ENCOUNTER — Other Ambulatory Visit (HOSPITAL_BASED_OUTPATIENT_CLINIC_OR_DEPARTMENT_OTHER): Payer: Self-pay | Admitting: Family Medicine

## 2022-10-12 ENCOUNTER — Ambulatory Visit (HOSPITAL_BASED_OUTPATIENT_CLINIC_OR_DEPARTMENT_OTHER)
Admission: RE | Admit: 2022-10-12 | Discharge: 2022-10-12 | Disposition: A | Discharge status: Home / Self Care | Payer: Medicaid Other | Source: Ambulatory Visit | Attending: Family Medicine | Admitting: Family Medicine

## 2022-10-12 DIAGNOSIS — R7612 Nonspecific reaction to cell mediated immunity measurement of gamma interferon antigen response without active tuberculosis: Secondary | ICD-10-CM

## 2022-10-23 ENCOUNTER — Encounter: Payer: Self-pay | Admitting: Gastroenterology

## 2022-10-23 ENCOUNTER — Ambulatory Visit: Payer: Medicaid Other | Admitting: Gastroenterology

## 2022-10-23 VITALS — BP 118/70 | HR 67 | Ht 63.0 in | Wt 146.0 lb

## 2022-10-23 DIAGNOSIS — K5900 Constipation, unspecified: Secondary | ICD-10-CM

## 2022-10-23 NOTE — Patient Instructions (Signed)
Continue Miralax for constipation.    If your blood pressure at your visit was 140/90 or greater, please contact your primary care physician to follow up on this.  0 If you are age 53 or older, your body mass index should be between 23-30. Your Body mass index is 25.86 kg/m. If this is out of the aforementioned range listed, please consider follow up with your Primary Care Provider.  If you are age 54 or younger, your body mass index should be between 19-25. Your Body mass index is 25.86 kg/m. If this is out of the aformentioned range listed, please consider follow up with your Primary Care Provider.    The [place] [facility] providers would like to encourage you to use [facility] to communicate with providers for non-urgent requests or questions.  Due to long hold times on the telephone, sending your provider a message by [facility] may be a faster and more efficient way to get a response.  Please allow 48 business hours for a response.  Please remember that this is for non-urgent requests.   It was a pleasure to see you today!  Thank you for trusting me with your gastrointestinal care!    Scott E.Tomasa Rand, MD

## 2022-10-23 NOTE — Progress Notes (Unsigned)
HPI : Joyce Frye is a 52 y.o. female with a history of chronic headaches and anxiety who is referred to [place] by Littie Deeds, MD for further evaluation of abdominal pain.  This patient speaks very little English and so is accompanied by a Orthoptist in clinic today. I initially saw the patient on May 15 with several months of upper abdominal pain as well as chronic constipation. An H. pylori stool antigen was ordered and was negative.  It was recommended she start taking MiraLAX daily and increase her dietary fiber and water intake. A Cologuard was ordered for colon cancer screening and this was also negative.  Today, the patient reports that her pain and constipation are much improved.  She has been taking MiraLAX every day, except occasionally when she forgets to take it.  She typically has a bowel movement 1-2 times per day.  Her stools are usually soft and easy to pass.  She denies any problems with straining except when she has hard stools.  No blood in the stool.  No problems with nausea or vomiting.  No heartburn or acid regurgitation.  No dysphagia. Her appetite is good and her weight is stable.   CT abdomen/pelvis with contrast Mar 26, 2022 IMPRESSION: 1. A specific cause for the patient's left lower quadrant abdominal pain is not identified. The distal half the colon is relatively empty aside for a small amount of fluid, and there are a few scattered nonspecific air-fluid levels in nondilated small bowel but no substantial bowel wall thickening, ascites, mesenteric edema, or dilated bowel. 2. Mild aortoiliac atherosclerotic vascular calcification. 3. Linear subsegmental atelectasis or scarring in the right lower lobe and right middle lobe. 4. Aortic atherosclerosis  Past Medical History:  Diagnosis Date   Cerebrovascular disease    Chronic headache    Strabismus    Stroke (HCC)    TB lung, latent      Past Surgical History:  Procedure Laterality Date    CHOLECYSTECTOMY     No family history on file. Social History   Tobacco Use   Smoking status: Never   Smokeless tobacco: Never  Vaping Use   Vaping status: Never Used  Substance Use Topics   Alcohol use: Never   Drug use: Never   Current Outpatient Medications  Medication Sig Dispense Refill   baclofen (LIORESAL) 10 MG tablet Take 1 tablet (10 mg total) by mouth 3 (three) times daily. 90 each 0   diclofenac Sodium (VOLTAREN) 1 % GEL Apply four times daily to affected area (Patient not taking: Reported on 07/30/2022) 100 g 3   famotidine (PEPCID) 20 MG tablet TAKE 1 TABLET(20 MG) BY MOUTH TWICE DAILY (Patient not taking: Reported on 07/30/2022) 180 tablet 2   hydrOXYzine (ATARAX) 10 MG tablet Take 1 tablet (10 mg total) by mouth 3 (three) times daily as needed for anxiety. (Patient not taking: Reported on 07/30/2022) 30 tablet 0   mirtazapine (REMERON) 7.5 MG tablet TAKE 1 TABLET(7.5 MG) BY MOUTH AT BEDTIME 90 tablet 1   ondansetron (ZOFRAN-ODT) 4 MG disintegrating tablet Take 1 tablet (4 mg total) by mouth every 8 (eight) hours as needed for nausea or vomiting. (Patient not taking: Reported on 07/30/2022) 20 tablet 0   polyethylene glycol powder (GLYCOLAX/MIRALAX) 17 GM/SCOOP powder Take 17 g by mouth daily. 500 g 0   pyridOXINE (VITAMIN B-6) 25 MG tablet Take 25 mg by mouth daily.     rosuvastatin (CRESTOR) 10 MG tablet Take 1 tablet (10 mg  total) by mouth daily. 90 tablet 3   No current facility-administered medications for this visit.   Allergies  Allergen Reactions   Paracetamol [Acetaminophen]     Tremors      Review of Systems: All systems reviewed and negative except where noted in HPI.    DG Chest 1 View  Result Date: 10/16/2022 CLINICAL DATA:  Response to cell mediated gamma interferon antigen without active tuberculosis. EXAM: CHEST  1 VIEW COMPARISON:  03/19/2022 FINDINGS: Heart size is normal. Mediastinal shadows are normal. The lungs are clear. No evidence of old or  active tuberculosis or other chest infection. No visible effusion. No abnormal bone finding. IMPRESSION: No active disease. No evidence of old or active tuberculosis. Electronically Signed   By: Paulina Fusi M.D.   On: 10/16/2022 12:03    Physical Exam: BP 118/70   Pulse 67   Ht 5\' 3"  (1.6 m)   Wt 146 lb (66.2 kg)   BMI 25.86 kg/m  Constitutional: Pleasant,well-developed, [place] female in no acute distress.  Accompanied by medical translator and sponsor HEENT: Normocephalic and atraumatic. Conjunctivae are normal. No scleral icterus. Cardiovascular: Normal rate, regular rhythm.  Pulmonary/chest: Effort normal and breath sounds normal. No wheezing, rales or rhonchi. Abdominal: Soft, nondistended, nontender. Bowel sounds active throughout. There are no masses palpable. No hepatomegaly. Neurological: Alert and oriented to person place and time. Psychiatric: Normal mood and affect. Behavior is normal.  CBC    Component Value Date/Time   WBC 6.1 03/26/2022 0715   RBC 4.71 03/26/2022 0715   HGB 14.1 03/26/2022 0715   HGB 13.5 04/20/2020 0928   HCT 41.9 03/26/2022 0715   HCT 40.3 04/20/2020 0928   PLT 205 03/26/2022 0715   PLT 271 04/20/2020 0928   MCV 89.0 03/26/2022 0715   MCV 87 04/20/2020 0928   MCH 29.9 03/26/2022 0715   MCHC 33.7 03/26/2022 0715   RDW 12.3 03/26/2022 0715   RDW 12.6 04/20/2020 0928   LYMPHSABS 2.3 10/01/2020 0712   LYMPHSABS 2.0 04/20/2020 0928   MONOABS 0.3 10/01/2020 0712   EOSABS 0.0 10/01/2020 0712   EOSABS 0.1 04/20/2020 0928   BASOSABS 0.0 10/01/2020 0712   BASOSABS 0.0 04/20/2020 0928    CMP     Component Value Date/Time   NA 142 03/26/2022 0715   NA 142 04/20/2020 0928   K 3.5 03/26/2022 0715   CL 108 03/26/2022 0715   CO2 24 03/26/2022 0715   GLUCOSE 111 (H) 03/26/2022 0715   BUN 21 (H) 03/26/2022 0715   BUN 16 04/20/2020 0928   CREATININE 0.72 03/26/2022 0715   CALCIUM 9.6 03/26/2022 0715   PROT 7.9 03/26/2022 0715   PROT 8.1  04/20/2020 0928   ALBUMIN 4.3 03/26/2022 0715   ALBUMIN 4.7 04/20/2020 0928   AST 30 03/26/2022 0715   ALT 7 03/26/2022 0715   ALKPHOS 64 03/26/2022 0715   BILITOT 1.8 (H) 03/26/2022 0715   BILITOT 0.4 04/20/2020 0928   GFRNONAA >60 03/26/2022 0715   GFRAA 117 04/20/2020 0928       Latest Ref Rng & Units 03/26/2022    7:15 AM 03/19/2022   11:28 AM 10/01/2020    7:12 AM  CBC EXTENDED  WBC 4.0 - 10.5 K/uL 6.1  3.4  6.5   RBC 3.87 - 5.11 MIL/uL 4.71  4.45  4.52   Hemoglobin 12.0 - 15.0 g/dL 84.1  32.4  40.1   HCT 36.0 - 46.0 % 41.9  38.9  39.7  Platelets 150 - 400 K/uL 205  193  257   NEUT# 1.7 - 7.7 K/uL   3.8   Lymph# 0.7 - 4.0 K/uL   2.3       ASSESSMENT AND PLAN: 52 year old female with chronic abdominal pain and constipation.  Both pain and constipation symptoms have been relieved with addition of daily MiraLAX.  Testing for H. pylori was negative.  She has no other concerning symptoms.  No further testing, evaluation or treatment is needed. I recommend she continue MiraLAX once daily indefinitely.  We discussed the safety of long-term MiraLAX use.  Follow-up as needed.  She will be due for colon cancer screening again in 3 years.  Constipation - Continue MiraLax daily   [employer] E. Tomasa Rand, MD [facility]  Littie Deeds, MD

## 2023-01-29 ENCOUNTER — Other Ambulatory Visit: Payer: Self-pay

## 2023-01-29 DIAGNOSIS — F4320 Adjustment disorder, unspecified: Secondary | ICD-10-CM

## 2023-01-29 MED ORDER — MIRTAZAPINE 7.5 MG PO TABS
7.5000 mg | ORAL_TABLET | Freq: Every day | ORAL | 1 refills | Status: DC
Start: 2023-01-29 — End: 2023-07-23

## 2023-02-07 ENCOUNTER — Other Ambulatory Visit: Payer: Self-pay | Admitting: Gastroenterology

## 2023-04-05 ENCOUNTER — Encounter: Payer: Self-pay | Admitting: Family Medicine

## 2023-04-05 ENCOUNTER — Ambulatory Visit: Payer: Medicaid Other | Admitting: Family Medicine

## 2023-04-05 VITALS — BP 116/79 | HR 115 | Ht 63.0 in | Wt 153.6 lb

## 2023-04-05 DIAGNOSIS — R109 Unspecified abdominal pain: Secondary | ICD-10-CM

## 2023-04-05 DIAGNOSIS — H579 Unspecified disorder of eye and adnexa: Secondary | ICD-10-CM

## 2023-04-05 DIAGNOSIS — K5909 Other constipation: Secondary | ICD-10-CM | POA: Diagnosis not present

## 2023-04-05 LAB — POCT URINALYSIS DIP (MANUAL ENTRY)
Bilirubin, UA: NEGATIVE
Blood, UA: NEGATIVE
Glucose, UA: NEGATIVE mg/dL
Ketones, POC UA: NEGATIVE mg/dL
Leukocytes, UA: NEGATIVE
Nitrite, UA: NEGATIVE
Protein Ur, POC: NEGATIVE mg/dL
Spec Grav, UA: 1.015 (ref 1.010–1.025)
Urobilinogen, UA: 0.2 U/dL
pH, UA: 6.5 (ref 5.0–8.0)

## 2023-04-05 MED ORDER — POLYETHYLENE GLYCOL 3350 17 GM/SCOOP PO POWD
1.0000 | Freq: Every day | ORAL | 1 refills | Status: DC
Start: 2023-04-05 — End: 2023-07-29

## 2023-04-05 NOTE — Patient Instructions (Addendum)
It was great to see you! Thank you for allowing me to participate in your care!  Our plans for today:  - Your stomach pain is likely caused by your mood. - I strongly recommend you talk about how you are feeling with your family and consider seeing a therapist. - Please continue to take Miralax.   Please arrive 15 minutes PRIOR to your next scheduled appointment time! If you do not, this affects OTHER patients' care.  Take care and seek immediate care sooner if you develop any concerns.   Celine Mans, MD, PGY-2 [facility] 3:02 PM 04/05/2023  [facility]

## 2023-04-05 NOTE — Progress Notes (Signed)
    SUBJECTIVE:   CHIEF COMPLAINT / HPI: eye referral  Stomach pain -2-3 months -right side down at the bottom -constipation -previously seen Dr. Tomasa Rand -pain is sharp -worse than previously -it is associated with good or bad mood -no relation with bowel movements -miralax has helped constipation -son has been away for 9 years -no triggers -no vomiting or diarrhea -no pain with peeing -no vaginal bleeding -pain does not radiate -does not talk to family members about pain   PERTINENT  PMH / PSH: Strabismus  OBJECTIVE:   BP 116/79   Pulse (!) 115   Ht 5\' 3"  (1.6 m)   Wt 153 lb 9.6 oz (69.7 kg)   SpO2 100%   BMI 27.21 kg/m   General: NAD, well appearing Neuro: A&O Respiratory: normal WOB on RA Extremities: Moving all 4 extremities equally Abdomen: soft, non-tender, no rebound or guarding  ASSESSMENT/PLAN:   Assessment & Plan Stomach pain Suspect the patient stomach pain is somatic pain secondary to stress due to anxiety/depression regarding son who she has not seen in many years, as she notices it only when she is upset.  She is overall reassuring on exam without indications of infectious, structural, renal, hepatic or metabolic pathology.  There may be mild component of constipation given history. She is up-to-date on colonoscopy.  Suspect that her pain will improve with counseling and discussion regarding her feelings.  Patient is very against counseling and declined further options.  Discussed that she may continue to take Tylenol and Pepcid as needed if this does help with her pain.  Explained that I do not think that this will be the case.  It is reasonable to evaluate electrolytes, liver and cell lines as well as renal function given that her this abdominal pain is new.  Patient agreeable to CBC, CMP and UA.  Follow-up in 1 month.  UA unremarkable.  Will discuss other lab results at follow-up. Difficulty seeing Optometry referral sent. Chronic  constipation Miralax refilled per request.  Return in about 1 month (around 05/06/2023).  Celine Mans, MD [facility]

## 2023-04-06 LAB — CBC WITH DIFFERENTIAL/PLATELET
Basophils Absolute: 0 10*3/uL (ref 0.0–0.2)
Basos: 1 %
EOS (ABSOLUTE): 0.1 10*3/uL (ref 0.0–0.4)
Eos: 1 %
Hematocrit: 39.8 % (ref 34.0–46.6)
Hemoglobin: 13.1 g/dL (ref 11.1–15.9)
Immature Grans (Abs): 0 10*3/uL (ref 0.0–0.1)
Immature Granulocytes: 0 %
Lymphocytes Absolute: 2.7 10*3/uL (ref 0.7–3.1)
Lymphs: 48 %
MCH: 29.3 pg (ref 26.6–33.0)
MCHC: 32.9 g/dL (ref 31.5–35.7)
MCV: 89 fL (ref 79–97)
Monocytes Absolute: 0.3 10*3/uL (ref 0.1–0.9)
Monocytes: 6 %
Neutrophils Absolute: 2.4 10*3/uL (ref 1.4–7.0)
Neutrophils: 44 %
Platelets: 230 10*3/uL (ref 150–450)
RBC: 4.47 x10E6/uL (ref 3.77–5.28)
RDW: 13.1 % (ref 11.7–15.4)
WBC: 5.6 10*3/uL (ref 3.4–10.8)

## 2023-04-06 LAB — COMPREHENSIVE METABOLIC PANEL
ALT: 4 [IU]/L (ref 0–32)
AST: 31 [IU]/L (ref 0–40)
Albumin: 4.7 g/dL (ref 3.8–4.9)
Alkaline Phosphatase: 89 [IU]/L (ref 44–121)
BUN/Creatinine Ratio: 22 (ref 9–23)
BUN: 15 mg/dL (ref 6–24)
Bilirubin Total: 1.2 mg/dL (ref 0.0–1.2)
CO2: 24 mmol/L (ref 20–29)
Calcium: 10.1 mg/dL (ref 8.7–10.2)
Chloride: 105 mmol/L (ref 96–106)
Creatinine, Ser: 0.67 mg/dL (ref 0.57–1.00)
Globulin, Total: 3.4 g/dL (ref 1.5–4.5)
Glucose: 88 mg/dL (ref 70–99)
Potassium: 4.4 mmol/L (ref 3.5–5.2)
Sodium: 144 mmol/L (ref 134–144)
Total Protein: 8.1 g/dL (ref 6.0–8.5)
eGFR: 104 mL/min/{1.73_m2} (ref >59)

## 2023-04-09 ENCOUNTER — Encounter: Payer: Self-pay | Admitting: Family Medicine

## 2023-04-11 ENCOUNTER — Ambulatory Visit: Payer: Medicaid Other

## 2023-04-11 VITALS — BP 110/75 | HR 75 | Wt 153.4 lb

## 2023-04-11 DIAGNOSIS — L918 Other hypertrophic disorders of the skin: Secondary | ICD-10-CM | POA: Diagnosis not present

## 2023-04-11 NOTE — Patient Instructions (Signed)
Skin Tag Removal Wound Care instructions  . Sometimes bandages are not necessary.  If a bandage is used, leave the original bandage on for 24 hours if possible.  If the bandage becomes soaked or soiled before that time, it is OK to remove it and examine the wound.  A small amount of post-operative bleeding is normal.  If excessive bleeding occurs, remove the bandage, place gauze over the site and apply continuous pressure (no peeking) over the area for 20-30 minutes.  If this does not stop the bleeding, try again for 40 minutes.  If this does not work, please call our clinic as soon as possible (even if after hours).    . Once a day, cleanse the wound with soap and water.  If a thick crust develops you may use a Q-tip dipped into dilute hydrogen peroxide (mix 1:1 with water) to dissolve it.  Hydrogen peroxide can slow the healing process, so use it only as needed.  After washing, apply Vaseline jelly or Polysporin ointment.  For best healing, the wound should be covered with a layer of ointment at all times.  This may mean re-applying the ointment several times a day.  For open wounds, continue until it has healed.    . If you have any swelling, keep the area elevated.  . Some redness, tenderness and white or yellow material in the wound is normal healing.  If the area becomes very sore and red, or develops a thick yellow-green material (pus), it may be infected; please notify us.    . Wound healing continues for up to one year following surgery.  It is not unusual to experience pain in the scar from time to time during the interval.  If the pain becomes severe or the scar thickens, you should notify the office.  A slight amount of redness in a scar is expected for the first six months.  After six months, the redness subsides and the scar will soften and fade.  The color difference becomes less noticeable with time.  If there are any problems, return for a post-op surgery check at your earliest  convenience.  . It is important to wear sunscreen daily with an SPF of 30 or higher over the treated sites and to wear sun-protective clothing.  . Please call our office for any questions or concerns.  

## 2023-04-11 NOTE — Progress Notes (Signed)
    SUBJECTIVE:   CHIEF COMPLAINT / HPI:   BS is a 53yo F w/ hx of dyslipidemia that p/f skin tag on L cheek. - Reports having this lesion for past few years now - It doesn't itch or bleed - It sometimes hurts when she snags it on something (such as putting a shirt on) - No other marks or rashes that she is worried about.   OBJECTIVE:   BP 110/75   Pulse 75   Wt 153 lb 6.4 oz (69.6 kg)   SpO2 97%   BMI 27.17 kg/m   General: Alert, pleasant woman. NAD. HEENT: NCAT. MMM. CV: RRR, no murmurs. Cap refill <2. Resp: CTAB, no wheezing or crackles. Normal WOB on RA.  Abm: Soft, nontender, nondistended. BS present. Ext: Moves all ext spontaneously Skin: Pedunculated rasied hyperpigmented lesion on L cheek  ASSESSMENT/PLAN:   Assessment & Plan Skin tag Lesion most c/w skin tag vs nevus. Pt would like it removed. Will proceed with excision.  Excision of Benign Skin Lesion Procedure Note  PRE-OP DIAGNOSIS: Facial skin tag  POST-OP DIAGNOSIS: Same   PROCEDURE: skin lesion excision  Anesthesia: ~0.5cc of lidocaine  Performing Physician: Lincoln Brigham  Supervising Physician: Janit Pagan  PROCEDURE:   The area surrounding the skin lesion was prepared in sterile manner. Area was numbed with lidocaine and scissors used to remove lesion. Hemostasis achieved and wound bandaged.  Followup: The patient tolerated the procedure well without  complications.  Standard post-procedure care is explained and return  precautions are given.      Lincoln Brigham, MD [facility]

## 2023-04-11 NOTE — Addendum Note (Signed)
Addended by: Janit Pagan T on: 04/11/2023 09:55 AM   Modules accepted: Level of Service

## 2023-04-17 DIAGNOSIS — H5213 Myopia, bilateral: Secondary | ICD-10-CM | POA: Diagnosis not present

## 2023-05-03 ENCOUNTER — Ambulatory Visit: Payer: Medicaid Other | Admitting: Family Medicine

## 2023-05-06 ENCOUNTER — Ambulatory Visit (INDEPENDENT_AMBULATORY_CARE_PROVIDER_SITE_OTHER): Payer: Medicaid Other | Admitting: Student

## 2023-05-06 ENCOUNTER — Encounter: Payer: Self-pay | Admitting: Student

## 2023-05-06 VITALS — BP 112/78 | HR 63 | Ht 63.0 in | Wt 153.2 lb

## 2023-05-06 DIAGNOSIS — M545 Low back pain, unspecified: Secondary | ICD-10-CM | POA: Diagnosis not present

## 2023-05-06 NOTE — Assessment & Plan Note (Addendum)
Patient comes for follow-up of her lumbar back pain.  Patient reports pain is flared, has been an issue for the last few months, denies any history of trauma or injury.  Patient had x-rays done in 2022 that showed arthritis in her lower back, and patient completed course of physical therapy, without improvement in back pain or symptoms.  Patient now reports back pain running down her legs bilaterally to her toes.  Some concern for possible bulging disc will recommend MRI lumbar.  Will also recommend over-the-counter medicine for pain control. - MRI lumbar - Ibuprofen 800 mg 3 times daily - Tylenol 1000 mg 3 times daily - Stagger dosing of ibuprofen and Tylenol - Consider lidocaine patches as needed

## 2023-05-06 NOTE — Progress Notes (Signed)
  SUBJECTIVE:   CHIEF COMPLAINT / HPI:   Here to follow up on labs  Back pain Has been hurting 5-6 months, has a hx of it hurting, but had gotten better. Has arthritis in back on 08/19/20 DG lumbar. Has not injured her back recently. Notes she taking tylenol as needed. Is taking miralax for constipation and it helps. Is having regular bowel movements and urination. Pain began years ago after lifting heavy water bags, and got better with home remedies, but since lifting heavy vacuum has had pain. Has gone to PT and it didn't help.   Needs instructions on how much ibuprofen and tylenol. May need xray for buldging disk. Lidocain patches and ortho referral.  PERTINENT  PMH / PSH:    OBJECTIVE:  There were no vitals taken for this visit. Physical Exam Musculoskeletal:     Lumbar back: Tenderness and bony tenderness present. No swelling, edema, deformity, signs of trauma, lacerations or spasms. Normal range of motion. Positive right straight leg raise test and positive left straight leg raise test. No scoliosis.      ASSESSMENT/PLAN:   Assessment & Plan Lumbar back pain Patient comes for follow-up of her lumbar back pain.  Patient reports pain is flared, has been an issue for the last few months, denies any history of trauma or injury.  Patient had x-rays done in 2022 that showed arthritis in her lower back, and patient completed course of physical therapy, without improvement in back pain or symptoms.  Patient now reports back pain running down her legs bilaterally to her toes.  Some concern for possible bulging disc will recommend MRI lumbar.  Will also recommend over-the-counter medicine for pain control. - MRI lumbar - Ibuprofen 800 mg 3 times daily - Tylenol 1000 mg 3 times daily - Stagger dosing of ibuprofen and Tylenol - Consider lidocaine patches as needed No follow-ups on file. Bess Kinds, MD 05/06/2023, 7:08 AM PGY-3, [facility]

## 2023-05-06 NOTE — Patient Instructions (Addendum)
It was great to see you! Thank you for allowing me to participate in your care!  I recommend that you always bring your medications to each appointment as this makes it easy to ensure we are on the correct medications and helps [place] not miss when refills are needed.  Our plans for today:  - Back Pain We are getting an MRI of your back to see if there is anything causing your back pain that spreads down your legs.   You can use lidocaine patches, these are available over the counter in most pharmacies ([facility], [facility], [facility])       You can take Tylenol and Ibuprofen as needed for back pain   Tylenol: 1000 mg (500 mg x 2) every 8 hours   Ibuprofen: 800 mg (200 mg x 4) every 8 hours    Take care and seek immediate care sooner if you develop any concerns.   Dr. Bess Kinds, MD [facility]    ???? ??? ???? ???! ???? ?? ????? ?? ?? ????? ????? ?? ?? ?????? ??? ?????? ???!  ?? ????? ????? ?? ??? ????? ????? ??? ??? ?? ?? ?? ?????? ??????? ???? ??? ??? ????? ??? ?? ????? ?? ????? ??? ???? ?? ???? ?????? ???? ?????? ? ?? ?? ??? ????? ?? ??????? ???? ?? ?? ???? ???? ???? ?? ??? ?????.  ?????? ??? ?? ???? ?????:  - ??? ??? ?? ?? MRI ??? ??? ?? ????? ?????? ?? ?????? ?? ??? ???? ??? ??? ??? ??? ????? ?? ?? ????? ??? ??? ????? ?? ???.   ??? ???????? ?? ?? ??? ????????? ??????? ????? ??? ?? ?? ?????? ?? ?????? ?? (????????? ?? ?? ??? ???????) ???? ???? ????? ???           ??? ???????? ??????? ? ?????????? ?? ?? ???? ???? ???? ??? ??? ???? ????   ???????: 1000 ???? ??? (500 ???? ??? x 2) ?? 8 ????   ??????????: 800 ???? ??? (200 ???? ??? x 4) ?? 8 ????    ????? ????? ? ??? ??? ?? ??? ?????? ?? ????? ??????? ????? ?? ????? ?????? ???? ?????.   ????? ?????? ????? MD ????? ???????? ?????

## 2023-05-21 ENCOUNTER — Inpatient Hospital Stay: Admission: RE | Admit: 2023-05-21 | Payer: Medicaid Other | Source: Ambulatory Visit

## 2023-05-23 ENCOUNTER — Ambulatory Visit
Admission: RE | Admit: 2023-05-23 | Discharge: 2023-05-23 | Disposition: A | Discharge status: Home / Self Care | Source: Ambulatory Visit | Attending: Family Medicine | Admitting: Family Medicine

## 2023-05-23 DIAGNOSIS — M545 Low back pain, unspecified: Secondary | ICD-10-CM

## 2023-06-20 ENCOUNTER — Ambulatory Visit: Admitting: Family Medicine

## 2023-06-26 ENCOUNTER — Encounter: Payer: Self-pay | Admitting: Family Medicine

## 2023-06-26 ENCOUNTER — Ambulatory Visit: Admitting: Family Medicine

## 2023-06-26 VITALS — BP 101/64 | Ht 63.0 in | Wt 153.0 lb

## 2023-06-26 DIAGNOSIS — M5416 Radiculopathy, lumbar region: Secondary | ICD-10-CM | POA: Insufficient documentation

## 2023-06-26 DIAGNOSIS — M47816 Spondylosis without myelopathy or radiculopathy, lumbar region: Secondary | ICD-10-CM | POA: Diagnosis not present

## 2023-06-26 MED ORDER — GABAPENTIN 300 MG PO CAPS: 300.0000 mg | ORAL_CAPSULE | Freq: Every day | ORAL | 1 refills | Status: DC

## 2023-06-26 NOTE — Progress Notes (Unsigned)
  Joyce Frye - 53 y.o. female MRN [id number]  Date of birth: 1970/07/07  PCP: Lorayne Bender, MD  Subjective:  No chief complaint on file.  Back pain   HPI: Past Medical, Surgical, Social, and Family History Reviewed & Updated per EMR.   An in person interpreter was used to assist with the visit.  Patient is a 54 y.o. female here for pain in the left lumbar area that is sharp, aching, unchanged, moderate-severe, and radiates down the back of both legs to the foot and webbing of the 1-2 digits. She was seen for this previously and initially did PT in 2022 which did not help much. Since moving to the [place] states she has been doing a lot of cleaning and lifting vacuums exacerbates the pain. She has tried tylenol and ibuprofen intermittently, lidocaine patches, and heat since her last visit in 04/2023 but still has ongoing pain. She recently had an MRI done. She denies any numbness of the lower extremities, fevers, chills, loss of bowel or bladder or weakness. She has not had any falls or trauma to the area.    Past Surgical History:  Procedure Laterality Date   CHOLECYSTECTOMY      Allergies  Allergen Reactions   Paracetamol [Acetaminophen]     Tremors         Objective:  Physical Exam: VS: BP:101/64  HR: bpm  TEMP: ( )  RESP:   HT:5\' 3"  (160 cm)   WT:153 lb (69.4 kg)  BMI:27.11  Gen: Well developed, NAD, speaks clearly, comfortable in exam room Respiratory: Normal work of breathing on room air, no respiratory distress Skin: No rashes, abrasions, or ecchymosis MSK:  Inspection shows normal lumbar lordosis There is TTP over the mid L5-S1 area involving the paraspinal muscles but not the spinous processes Trendelenberg positive bilaterally Hip ROM full Flexion, adduction, and extension strength 5/5 Hip abduction strength 3/5 Knee and foot ROM and strength is full Gait: slightly antalgic Neuro: DTRs 1+ and equal at the patella and achilles, no sensory deficits   Assessment &  Plan:   Lumbar radiculopathy, chronic - I reviewed the patient's MRI results which showed facet arthropathy and mild foraminal stenosis bilaterally at L4-L5. This is somewhat inconsistent with the L5-S1 distribution of pain.  - We discussed treatment options and the patient would like to pursue conservative management first with another PT course focusing on flexion based therapy and hip abduction strengthening.  - We also discussed the use of ice and gabapentin for her pain. I counseled the patient on the side effect profile of the medication and risks. She will start 300mg  PO at bedtime.  - Follow up in 4 weeks.     Rica Mote MD [facility]

## 2023-06-26 NOTE — Assessment & Plan Note (Addendum)
-   I reviewed the patient's MRI results which showed facet arthropathy and mild foraminal stenosis bilaterally at L4-L5. This is somewhat inconsistent with the L5-S1 distribution of pain.  - We discussed treatment options and the patient would like to pursue conservative management first with another PT course focusing on flexion based therapy and hip abduction strengthening.  - We also discussed the use of ice and gabapentin for her pain. I counseled the patient on the side effect profile of the medication and risks. She will start 300mg  PO at bedtime.  - Follow up in 4 weeks.

## 2023-07-03 ENCOUNTER — Other Ambulatory Visit: Payer: Self-pay

## 2023-07-03 ENCOUNTER — Ambulatory Visit: Attending: Family Medicine

## 2023-07-03 DIAGNOSIS — R2689 Other abnormalities of gait and mobility: Secondary | ICD-10-CM | POA: Insufficient documentation

## 2023-07-03 DIAGNOSIS — M47816 Spondylosis without myelopathy or radiculopathy, lumbar region: Secondary | ICD-10-CM | POA: Diagnosis not present

## 2023-07-03 DIAGNOSIS — M6281 Muscle weakness (generalized): Secondary | ICD-10-CM | POA: Diagnosis not present

## 2023-07-03 DIAGNOSIS — M5459 Other low back pain: Secondary | ICD-10-CM | POA: Diagnosis not present

## 2023-07-03 NOTE — Therapy (Signed)
 OUTPATIENT PHYSICAL THERAPY THORACOLUMBAR EVALUATION   Patient Name: Joyce Frye MRN: [id number] DOB:20-May-1970, 53 y.o., female Today's Date: 07/03/2023  END OF SESSION:  PT End of Session - 07/03/23 1652     Visit Number 1    Number of Visits 17    Date for PT Re-Evaluation 08/28/23    Authorization Type [employer]    PT Start Time 1445    PT Stop Time 1525    PT Time Calculation (min) 40 min    Behavior During Therapy [facility] for tasks assessed/performed             Past Medical History:  Diagnosis Date   Cerebrovascular disease    Chronic headache    Strabismus    Stroke (HCC)    TB lung, latent    Past Surgical History:  Procedure Laterality Date   CHOLECYSTECTOMY     Patient Active Problem List   Diagnosis Date Noted   Lumbar radiculopathy, chronic 06/26/2023   Anxiety 07/05/2021   Paresthesia 09/29/2020   Depression 09/29/2020   Abnormal vision 09/29/2020   Lumbar back pain 08/21/2020   Dyslipidemia 05/28/2020   [facility] examination 04/19/2020   Strabismus 04/19/2020   TB lung, latent 04/19/2020    PCP: Naida Austria, MD  REFERRING PROVIDER: Claybon Cuna, MD  REFERRING DIAG: [phone] (ICD-10-CM) - Lumbar arthropathy   Rationale for Evaluation and Treatment: Rehabilitation  THERAPY DIAG:  Other low back pain - Plan: PT plan of care cert/re-cert  Muscle weakness (generalized) - Plan: PT plan of care cert/re-cert  Other abnormalities of gait and mobility - Plan: PT plan of care cert/re-cert  ONSET DATE: Chronic  SUBJECTIVE:                                                                                                                                                                                           SUBJECTIVE STATEMENT: Pt presents to PT with reports of chronic lower back pain that radiates down posterior thighs to 2nd toe on each side. Denies N/T, sensation down LE is just painful. Also denies bowel/bladder changes or  saddle anesthesia. Is not able to find positions of comfort. Is frustrated by continued pain and inability to improve comfort with ADLs over last few years. Recent MRI shows lumbar stenosis. Past therapy unsuccessful.   PERTINENT HISTORY:  CVA  PAIN:  Are you having pain?  Yes: NPRS scale: 5/10 Worst: 9/10 Pain location: lower back, posterior LE down to 2 toe Pain description: sharp, sore Aggravating factors: standing, walking Relieving factors: medication  PRECAUTIONS: None  RED FLAGS: None   WEIGHT BEARING RESTRICTIONS: No  FALLS:  Has patient fallen in last 6 months? No  LIVING ENVIRONMENT: Lives with: lives with their family Lives in: House/apartment Stairs: No Has following equipment at home: None  OCCUPATION: Not working  PLOF: Independent  PATIENT GOALS: decrease back pain, improve standing toelrance and comfort  NEXT MD VISIT: 07/31/2023  OBJECTIVE:  Note: Objective measures were completed at Evaluation unless otherwise noted.  DIAGNOSTIC FINDINGS:  See imaging for lumbar MRI  PATIENT SURVEYS:  ODI: 25/50 - 50% disability   COGNITION: Overall cognitive status: Within functional limits for tasks assessed     SENSATION: WFL  MUSCLE LENGTH: Thomas test: Right (+) deg; Left (+) deg  POSTURE: rounded shoulders, forward head, and increased lumbar lordosis  PALPATION: TTP to lumbar parapsinals  LUMBAR ROM:   AROM eval  Flexion 100%  Extension 25%  Right lateral flexion   Left lateral flexion   Right rotation 25%  Left rotation 25%   (Blank rows = not tested)  LOWER EXTREMITY MMT:    MMT Right eval Left eval  Hip flexion 3+/5 3+/5  Hip extension    Hip abduction 3/5 3/5  Hip adduction    Hip internal rotation    Hip external rotation    Knee flexion    Knee extension    Ankle dorsiflexion    Ankle plantarflexion    Ankle inversion    Ankle eversion     (Blank rows = not tested)  LUMBAR SPECIAL TESTS:  Straight leg raise test:  Negative and Slump test: Negative  FUNCTIONAL TESTS:  30 Second Sit to Stand: 5 reps with UE  GAIT: Distance walked: 48ft Assistive device utilized: None Level of assistance: Complete Independence Comments: trunk flexed, decreased gait speed  TREATMENT: [facility] Adult PT Treatment:                                                DATE: 07/03/2023 Therapeutic Exercise: SKTC x 20" each Seated clamshell x 15 GTB Seated repeated lumbar flexion x 10  PATIENT EDUCATION:  Education details: eval findings, ODI, HEP, POC Person educated: Patient Education method: Explanation, Demonstration, and Handouts Education comprehension: verbalized understanding and returned demonstration  HOME EXERCISE PROGRAM: Access Code: [email or web address] URL: [email or web address] Date: 07/03/2023 Prepared by: Loral Roch  Exercises - Supine Single Knee to Chest Stretch  - 1 x daily - 7 x weekly - 2-3 reps - 20 sec hold - Seated Hip Abduction with Resistance  - 1 x daily - 7 x weekly - 3 sets - 15 reps - green band hold - Seated Lumbar Flexion Stretch  - 1 x daily - 7 x weekly - 2 sets - 10 reps - 5 sec hold  ASSESSMENT:  CLINICAL IMPRESSION: Patient is a 53 y.o. F who was seen today for physical therapy evaluation and treatment for chronic LBP with referral into bilateral LE. Physical findings are consistent with MD impression as pt demonstrates decrease in core/hip strength and functional mobility. ODI score indicates severe disability in performance of home ADLs and community activities. Pt would benefit from skilled PT services working on flexion based stretching and core strengthening in order to decrease pain and improve comfort.    OBJECTIVE IMPAIRMENTS: Abnormal gait, decreased activity tolerance, decreased endurance, decreased mobility, difficulty walking, decreased ROM, decreased strength, postural dysfunction, and pain  ACTIVITY LIMITATIONS: carrying, lifting, standing, squatting, stairs,  transfers,  bed mobility, and locomotion level  PARTICIPATION LIMITATIONS: meal prep, cleaning, driving, shopping, community activity, and yard work  PERSONAL FACTORS: Time since onset of injury/illness/exacerbation and 1-2 comorbidities: CVA  are also affecting patient's functional outcome.   REHAB POTENTIAL: Good  CLINICAL DECISION MAKING: Evolving/moderate complexity  EVALUATION COMPLEXITY: Moderate   GOALS: Goals reviewed with patient? No  SHORT TERM GOALS: Target date: 07/24/2023   Pt will be compliant and knowledgeable with initial HEP for improved comfort and carryover Baseline: initial HEP given  Goal status: INITIAL  2.  Pt will self report low back and LE pain no greater than 6/10 for improved comfort and functional ability Baseline: 9/10 at worst Goal status: INITIAL   LONG TERM GOALS: Target date: 08/28/2023   Pt will be decrease ODI disability score to no greater than 35% (17/50) as proxy for functional improvement Baseline: 50% disability (25/50) Goal status: INITIAL  2.  Pt will self report low back and LE pain no greater than 3/10 for improved comfort and functional ability Baseline: 9/10 at worst Goal status: INITIAL   3.  Pt will increase 30 Second Sit to Stand rep count to no less than 9 reps for improved balance, strength, and functional mobility Baseline: 5 reps with UE  Goal status: INITIAL   4.  Pt will improve bilateral LE strength to no less than 4/5 for improved functional mobility and decrease pain Baseline: see MMT chart Goal status: INITIAL  5.  Pt will improve standing activity tolerance to at least 20 minutes not limited by pain for improved comfort and functional ability with home ADLs Baseline: unable Goal status: INITIAL   PLAN:  PT FREQUENCY: 1-2x/week  PT DURATION: 8 weeks  PLANNED INTERVENTIONS: 97164- PT Re-evaluation, 97110-Therapeutic exercises, 97530- Therapeutic activity, [id number]- Neuromuscular re-education, 97535- Self Care,  97140- Manual therapy, [id number]- Gait training, W0981- Electrical stimulation (unattended), [id number]- Electrical stimulation (manual), Dry Needling, Cryotherapy, and Moist heat  PLAN FOR NEXT SESSION: neutral spine core and hip strengthening, lumbar flexion based stretching, hip flexor stretching  For all possible CPT codes, reference the Planned Interventions line above.     Check all conditions that are expected to impact treatment: {Conditions expected to impact treatment:Musculoskeletal disorders   If treatment provided at initial evaluation, no treatment charged due to lack of authorization.       Ivor Mars, PT 07/03/2023, 4:53 PM

## 2023-07-11 DIAGNOSIS — H6123 Impacted cerumen, bilateral: Secondary | ICD-10-CM | POA: Diagnosis not present

## 2023-07-17 ENCOUNTER — Ambulatory Visit: Admitting: Physical Therapy

## 2023-07-17 ENCOUNTER — Encounter: Payer: Self-pay | Admitting: Physical Therapy

## 2023-07-17 DIAGNOSIS — M6281 Muscle weakness (generalized): Secondary | ICD-10-CM

## 2023-07-17 DIAGNOSIS — M5459 Other low back pain: Secondary | ICD-10-CM

## 2023-07-17 DIAGNOSIS — R2689 Other abnormalities of gait and mobility: Secondary | ICD-10-CM | POA: Diagnosis not present

## 2023-07-17 DIAGNOSIS — M47816 Spondylosis without myelopathy or radiculopathy, lumbar region: Secondary | ICD-10-CM | POA: Diagnosis not present

## 2023-07-17 NOTE — Therapy (Signed)
 OUTPATIENT PHYSICAL THERAPY THORACOLUMBAR TREATMENT    Patient Name: Joyce Frye MRN: [id number] DOB:12/07/1970, 53 y.o., female Today's Date: 07/17/2023  END OF SESSION:  PT End of Session - 07/17/23 1312     Visit Number 2    Number of Visits 17    Date for PT Re-Evaluation 08/28/23    Authorization Type [employer]    Authorization Time Period 07/08/23-09/05/23    Authorization - Visit Number 1    Authorization - Number of Visits 7    PT Start Time 1315    PT Stop Time 1353    PT Time Calculation (min) 38 min             Past Medical History:  Diagnosis Date   Cerebrovascular disease    Chronic headache    Strabismus    Stroke (HCC)    TB lung, latent    Past Surgical History:  Procedure Laterality Date   CHOLECYSTECTOMY     Patient Active Problem List   Diagnosis Date Noted   Lumbar radiculopathy, chronic 06/26/2023   Anxiety 07/05/2021   Paresthesia 09/29/2020   Depression 09/29/2020   Abnormal vision 09/29/2020   Lumbar back pain 08/21/2020   Dyslipidemia 05/28/2020   [facility] examination 04/19/2020   Strabismus 04/19/2020   TB lung, latent 04/19/2020    PCP: Naida Austria, MD  REFERRING PROVIDER: Claybon Cuna, MD  REFERRING DIAG: [phone] (ICD-10-CM) - Lumbar arthropathy   Rationale for Evaluation and Treatment: Rehabilitation  THERAPY DIAG:  Other low back pain  Muscle weakness (generalized)  Other abnormalities of gait and mobility  ONSET DATE: Chronic  SUBJECTIVE:                                                                                                                                                                                           SUBJECTIVE STATEMENT: Pt reports compliance with HEP and continued back pain.    EVAL: Pt presents to PT with reports of chronic lower back pain that radiates down posterior thighs to 2nd toe on each side. Denies N/T, sensation down LE is just painful. Also denies  bowel/bladder changes or saddle anesthesia. Is not able to find positions of comfort. Is frustrated by continued pain and inability to improve comfort with ADLs over last few years. Recent MRI shows lumbar stenosis. Past therapy unsuccessful.   PERTINENT HISTORY:  CVA  PAIN:  Are you having pain?  Yes: NPRS scale: 6/10 Worst: 9/10 Pain location: lower back, posterior LE down to 2 toe Pain description: sharp, sore Aggravating factors: standing, walking Relieving factors: medication  PRECAUTIONS: None  RED FLAGS:  None   WEIGHT BEARING RESTRICTIONS: No  FALLS:  Has patient fallen in last 6 months? No  LIVING ENVIRONMENT: Lives with: lives with their family Lives in: House/apartment Stairs: No Has following equipment at home: None  OCCUPATION: Not working  PLOF: Independent  PATIENT GOALS: decrease back pain, improve standing toelrance and comfort  NEXT MD VISIT: 07/31/2023  OBJECTIVE:  Note: Objective measures were completed at Evaluation unless otherwise noted.  DIAGNOSTIC FINDINGS:  See imaging for lumbar MRI  PATIENT SURVEYS:  ODI: 25/50 - 50% disability   COGNITION: Overall cognitive status: Within functional limits for tasks assessed     SENSATION: WFL  MUSCLE LENGTH: Thomas test: Right (+) deg; Left (+) deg  POSTURE: rounded shoulders, forward head, and increased lumbar lordosis  PALPATION: TTP to lumbar parapsinals  LUMBAR ROM:   AROM eval  Flexion 100%  Extension 25%  Right lateral flexion   Left lateral flexion   Right rotation 25%  Left rotation 25%   (Blank rows = not tested)  LOWER EXTREMITY MMT:    MMT Right eval Left eval  Hip flexion 3+/5 3+/5  Hip extension    Hip abduction 3/5 3/5  Hip adduction    Hip internal rotation    Hip external rotation    Knee flexion    Knee extension    Ankle dorsiflexion    Ankle plantarflexion    Ankle inversion    Ankle eversion     (Blank rows = not tested)  LUMBAR SPECIAL TESTS:   Straight leg raise test: Negative and Slump test: Negative  FUNCTIONAL TESTS:  30 Second Sit to Stand: 5 reps with UE  GAIT: Distance walked: 27ft Assistive device utilized: None Level of assistance: Complete Independence Comments: trunk flexed, decreased gait speed  TREATMENT: [facility] Adult PT Treatment:                                                DATE: 07/17/23  SKTC x 20" each Seated clamshell 1 x 20 GTB  Seated March GTB x 10 each side  Seated repeated lumbar flexion x 10 STS x 10 Standing hip abduction 10 x 2  Standing march x 10 each  SKTC  LTR x 10 each  Supine marching  Figure 4 QL stretch x 10 each  Double knee to chest  Updated HEP    [facility] Adult PT Treatment:                                                DATE: 07/03/2023 Therapeutic Exercise: SKTC x 20" each Seated clamshell x 15 GTB Seated repeated lumbar flexion x 10  PATIENT EDUCATION:  Education details: eval findings, ODI, HEP, POC Person educated: Patient Education method: Explanation, Demonstration, and Handouts Education comprehension: verbalized understanding and returned demonstration  HOME EXERCISE PROGRAM: Access Code: [email or web address] URL: [email or web address] Date: 07/03/2023 Prepared by: Loral Roch  Exercises - Supine Single Knee to Chest Stretch  - 1 x daily - 7 x weekly - 2-3 reps - 20 sec hold - Seated Hip Abduction with Resistance  - 1 x daily - 7 x weekly - 3 sets - 15 reps - green band hold - Seated Lumbar Flexion Stretch  - 1 x  daily - 7 x weekly - 2 sets - 10 reps - 5 sec hold 07/17/23:  Hip abduction standing 10 x 2 each   ASSESSMENT:  CLINICAL IMPRESSION: Pt reports compliance with HEP and continued pain. Began Nustep for activity tolerance and reviewed HEP today. Progressed therex in closed chain which she tolerated standing hip abduction well without increased pain. This was added to her HEP. She reported increased pain with most mat based therex although was able  to complete all prescribed therex.    EVAL: Patient is a 53 y.o. F who was seen today for physical therapy evaluation and treatment for chronic LBP with referral into bilateral LE. Physical findings are consistent with MD impression as pt demonstrates decrease in core/hip strength and functional mobility. ODI score indicates severe disability in performance of home ADLs and community activities. Pt would benefit from skilled PT services working on flexion based stretching and core strengthening in order to decrease pain and improve comfort.    OBJECTIVE IMPAIRMENTS: Abnormal gait, decreased activity tolerance, decreased endurance, decreased mobility, difficulty walking, decreased ROM, decreased strength, postural dysfunction, and pain  ACTIVITY LIMITATIONS: carrying, lifting, standing, squatting, stairs, transfers, bed mobility, and locomotion level  PARTICIPATION LIMITATIONS: meal prep, cleaning, driving, shopping, community activity, and yard work  PERSONAL FACTORS: Time since onset of injury/illness/exacerbation and 1-2 comorbidities: CVA  are also affecting patient's functional outcome.   REHAB POTENTIAL: Good  CLINICAL DECISION MAKING: Evolving/moderate complexity  EVALUATION COMPLEXITY: Moderate   GOALS: Goals reviewed with patient? No  SHORT TERM GOALS: Target date: 07/24/2023   Pt will be compliant and knowledgeable with initial HEP for improved comfort and carryover Baseline: initial HEP given  Goal status: INITIAL  2.  Pt will self report low back and LE pain no greater than 6/10 for improved comfort and functional ability Baseline: 9/10 at worst Goal status: INITIAL   LONG TERM GOALS: Target date: 08/28/2023   Pt will be decrease ODI disability score to no greater than 35% (17/50) as proxy for functional improvement Baseline: 50% disability (25/50) Goal status: INITIAL  2.  Pt will self report low back and LE pain no greater than 3/10 for improved comfort and functional  ability Baseline: 9/10 at worst Goal status: INITIAL   3.  Pt will increase 30 Second Sit to Stand rep count to no less than 9 reps for improved balance, strength, and functional mobility Baseline: 5 reps with UE  Goal status: INITIAL   4.  Pt will improve bilateral LE strength to no less than 4/5 for improved functional mobility and decrease pain Baseline: see MMT chart Goal status: INITIAL  5.  Pt will improve standing activity tolerance to at least 20 minutes not limited by pain for improved comfort and functional ability with home ADLs Baseline: unable Goal status: INITIAL   PLAN:  PT FREQUENCY: 1-2x/week  PT DURATION: 8 weeks  PLANNED INTERVENTIONS: 97164- PT Re-evaluation, 97110-Therapeutic exercises, 97530- Therapeutic activity, [id number]- Neuromuscular re-education, 97535- Self Care, 29562- Manual therapy, [id number]- Gait training, Z3086- Electrical stimulation (unattended), [id number]- Electrical stimulation (manual), Dry Needling, Cryotherapy, and Moist heat  PLAN FOR NEXT SESSION: neutral spine core and hip strengthening, lumbar flexion based stretching, hip flexor stretching  For all possible CPT codes, reference the Planned Interventions line above.     Check all conditions that are expected to impact treatment: {Conditions expected to impact treatment:Musculoskeletal disorders   If treatment provided at initial evaluation, no treatment charged due to lack of authorization.  Gasper Karst, PTA 07/17/23 1:54 PM Phone: [phone] Fax: [phone]

## 2023-07-19 ENCOUNTER — Encounter: Payer: Self-pay | Admitting: Physical Therapy

## 2023-07-19 ENCOUNTER — Ambulatory Visit: Attending: Family Medicine | Admitting: Physical Therapy

## 2023-07-19 DIAGNOSIS — M5459 Other low back pain: Secondary | ICD-10-CM | POA: Diagnosis not present

## 2023-07-19 DIAGNOSIS — M6281 Muscle weakness (generalized): Secondary | ICD-10-CM | POA: Diagnosis not present

## 2023-07-19 DIAGNOSIS — R2689 Other abnormalities of gait and mobility: Secondary | ICD-10-CM | POA: Diagnosis not present

## 2023-07-19 NOTE — Therapy (Signed)
 OUTPATIENT PHYSICAL THERAPY THORACOLUMBAR TREATMENT    Patient Name: Joyce Frye MRN: [id number] DOB:06/23/1970, 53 y.o., female Today's Date: 07/19/2023  END OF SESSION:  PT End of Session - 07/19/23 1310     Visit Number 3    Number of Visits 17    Date for PT Re-Evaluation 08/28/23    Authorization Type [employer]    Authorization Time Period 07/08/23-09/05/23    Authorization - Visit Number 2    Authorization - Number of Visits 7    PT Start Time 1230    PT Stop Time 1308    PT Time Calculation (min) 38 min              Past Medical History:  Diagnosis Date   Cerebrovascular disease    Chronic headache    Strabismus    Stroke (HCC)    TB lung, latent    Past Surgical History:  Procedure Laterality Date   CHOLECYSTECTOMY     Patient Active Problem List   Diagnosis Date Noted   Lumbar radiculopathy, chronic 06/26/2023   Anxiety 07/05/2021   Paresthesia 09/29/2020   Depression 09/29/2020   Abnormal vision 09/29/2020   Lumbar back pain 08/21/2020   Dyslipidemia 05/28/2020   [facility] examination 04/19/2020   Strabismus 04/19/2020   TB lung, latent 04/19/2020    PCP: Naida Austria, MD  REFERRING PROVIDER: Claybon Cuna, MD  REFERRING DIAG: [phone] (ICD-10-CM) - Lumbar arthropathy   Rationale for Evaluation and Treatment: Rehabilitation  THERAPY DIAG:  Other low back pain  Muscle weakness (generalized)  Other abnormalities of gait and mobility  ONSET DATE: Chronic  SUBJECTIVE:                                                                                                                                                                                           SUBJECTIVE STATEMENT: Pt reports exercises make her feel better. Back pain 7/10.    EVAL: Pt presents to PT with reports of chronic lower back pain that radiates down posterior thighs to 2nd toe on each side. Denies N/T, sensation down LE is just painful. Also denies  bowel/bladder changes or saddle anesthesia. Is not able to find positions of comfort. Is frustrated by continued pain and inability to improve comfort with ADLs over last few years. Recent MRI shows lumbar stenosis. Past therapy unsuccessful.   PERTINENT HISTORY:  CVA  PAIN:  Are you having pain?  Yes: NPRS scale: 7/10 Worst: 9/10 Pain location: lower back, posterior LE down to 2 toe Pain description: sharp, sore Aggravating factors: standing, walking Relieving factors: medication  PRECAUTIONS: None  RED FLAGS: None   WEIGHT BEARING RESTRICTIONS: No  FALLS:  Has patient fallen in last 6 months? No  LIVING ENVIRONMENT: Lives with: lives with their family Lives in: House/apartment Stairs: No Has following equipment at home: None  OCCUPATION: Not working  PLOF: Independent  PATIENT GOALS: decrease back pain, improve standing toelrance and comfort  NEXT MD VISIT: 07/31/2023  OBJECTIVE:  Note: Objective measures were completed at Evaluation unless otherwise noted.  DIAGNOSTIC FINDINGS:  See imaging for lumbar MRI  PATIENT SURVEYS:  ODI: 25/50 - 50% disability   COGNITION: Overall cognitive status: Within functional limits for tasks assessed     SENSATION: WFL  MUSCLE LENGTH: Thomas test: Right (+) deg; Left (+) deg  POSTURE: rounded shoulders, forward head, and increased lumbar lordosis  PALPATION: TTP to lumbar parapsinals  LUMBAR ROM:   AROM eval  Flexion 100%  Extension 25%  Right lateral flexion   Left lateral flexion   Right rotation 25%  Left rotation 25%   (Blank rows = not tested)  LOWER EXTREMITY MMT:    MMT Right eval Left eval  Hip flexion 3+/5 3+/5  Hip extension    Hip abduction 3/5 3/5  Hip adduction    Hip internal rotation    Hip external rotation    Knee flexion    Knee extension    Ankle dorsiflexion    Ankle plantarflexion    Ankle inversion    Ankle eversion     (Blank rows = not tested)  LUMBAR SPECIAL TESTS:   Straight leg raise test: Negative and Slump test: Negative  FUNCTIONAL TESTS:  30 Second Sit to Stand: 5 reps with UE  GAIT: Distance walked: 11ft Assistive device utilized: None Level of assistance: Complete Independence Comments: trunk flexed, decreased gait speed  TREATMENT: [facility] Adult PT Treatment:                                                DATE: 07/19/23 Nustep L4 Ue/LE x 5 minutes  Standing hip abduction 10 x 2  Standing march x 10 each - increased pain Bilat heel raise x10 Seated repeated lumbar flexion x 10 Seated clamshell 1 x 20 GTB  Seated March GTB x 10 each side  STS x 10, hands on thighs  SKTC  LTR x 10 each  Supine marching  Seated h/s stretch  bilat Seated LAQ alternating    [facility] Adult PT Treatment:                                                DATE: 07/17/23  SKTC x 20" each Seated clamshell 1 x 20 GTB  Seated March GTB x 10 each side  Seated repeated lumbar flexion x 10 STS x 10 Standing hip abduction 10 x 2  Standing march x 10 each  SKTC  LTR x 10 each  Supine marching  Figure 4 QL stretch x 10 each  Double knee to chest  Updated HEP    [facility] Adult PT Treatment:  DATE: 07/03/2023 Therapeutic Exercise: SKTC x 20" each Seated clamshell x 15 GTB Seated repeated lumbar flexion x 10  PATIENT EDUCATION:  Education details: eval findings, ODI, HEP, POC Person educated: Patient Education method: Explanation, Demonstration, and Handouts Education comprehension: verbalized understanding and returned demonstration  HOME EXERCISE PROGRAM: Access Code: [email or web address] URL: [email or web address] Date: 07/03/2023 Prepared by: Loral Roch  Exercises - Supine Single Knee to Chest Stretch  - 1 x daily - 7 x weekly - 2-3 reps - 20 sec hold - Seated Hip Abduction with Resistance  - 1 x daily - 7 x weekly - 3 sets - 15 reps - green band hold - Seated Lumbar Flexion Stretch  - 1 x daily - 7 x  weekly - 2 sets - 10 reps - 5 sec hold 07/17/23:  Hip abduction standing 10 x 2 each   ASSESSMENT:  CLINICAL IMPRESSION: Pt reports compliance with HEP and continued pain. Continued Nustep for activity tolerance and reviewed new HEP today. Progressed therex in closed chain which she reported increased pain after standing march. The rest of session was completed in seated or supine.  She reports that her exercises are helpful although her pain is a little higher than at her last visit. Will continue to assess response.    EVAL: Patient is a 53 y.o. F who was seen today for physical therapy evaluation and treatment for chronic LBP with referral into bilateral LE. Physical findings are consistent with MD impression as pt demonstrates decrease in core/hip strength and functional mobility. ODI score indicates severe disability in performance of home ADLs and community activities. Pt would benefit from skilled PT services working on flexion based stretching and core strengthening in order to decrease pain and improve comfort.    OBJECTIVE IMPAIRMENTS: Abnormal gait, decreased activity tolerance, decreased endurance, decreased mobility, difficulty walking, decreased ROM, decreased strength, postural dysfunction, and pain  ACTIVITY LIMITATIONS: carrying, lifting, standing, squatting, stairs, transfers, bed mobility, and locomotion level  PARTICIPATION LIMITATIONS: meal prep, cleaning, driving, shopping, community activity, and yard work  PERSONAL FACTORS: Time since onset of injury/illness/exacerbation and 1-2 comorbidities: CVA  are also affecting patient's functional outcome.   REHAB POTENTIAL: Good  CLINICAL DECISION MAKING: Evolving/moderate complexity  EVALUATION COMPLEXITY: Moderate   GOALS: Goals reviewed with patient? No  SHORT TERM GOALS: Target date: 07/24/2023   Pt will be compliant and knowledgeable with initial HEP for improved comfort and carryover Baseline: initial HEP given   Goal status: INITIAL  2.  Pt will self report low back and LE pain no greater than 6/10 for improved comfort and functional ability Baseline: 9/10 at worst Goal status: INITIAL   LONG TERM GOALS: Target date: 08/28/2023   Pt will be decrease ODI disability score to no greater than 35% (17/50) as proxy for functional improvement Baseline: 50% disability (25/50) Goal status: INITIAL  2.  Pt will self report low back and LE pain no greater than 3/10 for improved comfort and functional ability Baseline: 9/10 at worst Goal status: INITIAL   3.  Pt will increase 30 Second Sit to Stand rep count to no less than 9 reps for improved balance, strength, and functional mobility Baseline: 5 reps with UE  Goal status: INITIAL   4.  Pt will improve bilateral LE strength to no less than 4/5 for improved functional mobility and decrease pain Baseline: see MMT chart Goal status: INITIAL  5.  Pt will improve standing activity tolerance to at least 20 minutes not limited by pain for  improved comfort and functional ability with home ADLs Baseline: unable Goal status: INITIAL   PLAN:  PT FREQUENCY: 1-2x/week  PT DURATION: 8 weeks  PLANNED INTERVENTIONS: 97164- PT Re-evaluation, 97110-Therapeutic exercises, 97530- Therapeutic activity, [id number]- Neuromuscular re-education, 97535- Self Care, 16109- Manual therapy, [id number]- Gait training, [phone]- Electrical stimulation (unattended), [phone]- Electrical stimulation (manual), Dry Needling, Cryotherapy, and Moist heat  PLAN FOR NEXT SESSION: neutral spine core and hip strengthening, lumbar flexion based stretching, hip flexor stretching  For all possible CPT codes, reference the Planned Interventions line above.     Check all conditions that are expected to impact treatment: {Conditions expected to impact treatment:Musculoskeletal disorders   If treatment provided at initial evaluation, no treatment charged due to lack of authorization.       Gasper Karst, PTA 07/19/23 1:11 PM Phone: [phone] Fax: [phone]

## 2023-07-23 ENCOUNTER — Other Ambulatory Visit: Payer: Self-pay | Admitting: Family Medicine

## 2023-07-23 DIAGNOSIS — F4320 Adjustment disorder, unspecified: Secondary | ICD-10-CM

## 2023-07-29 ENCOUNTER — Other Ambulatory Visit (HOSPITAL_COMMUNITY)
Admission: RE | Admit: 2023-07-29 | Discharge: 2023-07-29 | Disposition: A | Discharge status: Home / Self Care | Source: Ambulatory Visit | Attending: Family Medicine | Admitting: Family Medicine

## 2023-07-29 ENCOUNTER — Encounter: Payer: Self-pay | Admitting: Family Medicine

## 2023-07-29 ENCOUNTER — Ambulatory Visit: Admitting: Family Medicine

## 2023-07-29 VITALS — BP 100/70 | HR 68 | Ht 63.0 in | Wt 150.0 lb

## 2023-07-29 DIAGNOSIS — Z1211 Encounter for screening for malignant neoplasm of colon: Secondary | ICD-10-CM | POA: Diagnosis not present

## 2023-07-29 DIAGNOSIS — Z124 Encounter for screening for malignant neoplasm of cervix: Secondary | ICD-10-CM | POA: Diagnosis not present

## 2023-07-29 DIAGNOSIS — K5909 Other constipation: Secondary | ICD-10-CM | POA: Diagnosis not present

## 2023-07-29 MED ORDER — POLYETHYLENE GLYCOL 3350 17 GM/SCOOP PO POWD: 17.0000 g | Freq: Every day | ORAL | 3 refills | Status: AC

## 2023-07-29 MED ORDER — HYDROCORTISONE 1 % EX OINT: 1.0000 | TOPICAL_OINTMENT | Freq: Every day | CUTANEOUS | 0 refills | Status: DC | PRN

## 2023-07-29 NOTE — Patient Instructions (Addendum)
 ???? ??? ????? ???? ???! ???? ?? ????? ????? ???????? ??? ?? ???? ?????? ????? ??? ?????? ?????. ?? ?? ????? ???? ????? ?? ????? ?????? ???? ??.  ????? ?? ???? ?????: 1. ????? - ???????? ?????? ?? ???. ?? ??????? ??? ?????? ???? ???? ???? ???? ?????????????? ????? ??? ??? ?? ?? ???? ???? ???? ????? ?? ???? ??? ???? ??????? ???? ???? ?????: ?? ?? ??? ?? ????: ?? ??? ???? ????? ???? ?? ????? ?????? ?? ???????? ???? 15-20 ????? ??? ???? ? ???? ? ??? ???? 2. ??? ????? ????? ????? ??? ?? ????? ???? ????? ???? 3. ????? ?? GI ???? ??????????? ?? hx ????? ? ?????? ??????? ?????.  ?? ??? ?? ?? GI ????? ???? ?? ?? ?????? ??? ?? ????? ??????? ???. ??? ??? ?? ???? ??? ?????? ?? ????? 3-4 ???? ???? ????? ?????? ????? ???? ????? ?? ???? ?? ?? ????? [phone] ???? ??????. ??? ??????? ??????? ?? ?? ?????? ????? ? ??????? ?? ??? ???? ?? ??? ??? ???.  ??? ??? ?? ???? ????? ???? ???? ???? ?? ??? ??? ???? ?? ?? ????? ????????? ??? ?????? ???? ????? ?????? ? ?? ????? ?????? ??? ????? ??? ?????? ????? ?????.  ?? ????? ???? ?? ??????????? ?? ?? ??????. ??? ???? ??????? ?????? ?? ?? ??? ???? ????? ????. ??? ???? ???? ?????? ?? ?? ???? MyChart (??? ???? ????) ?? ?? ???? ?? ????? ???? ??? ????? ????? ???. ??? ??? ?? ???? ????????? ??? ??? ?? 2 ???? ????? ???? ?????? ????? ?? ???? ???? ??????.  ??? ???? ?? ?????? ?? ??????? ??? ?????? ?? ???? ???? ?????. ????? 15 ????? ??? ?? ?????? ??? ????? ?? ?? ????? ?? ???? ????? ??????? ???? ????. ?? ?? ???? ??? ??? ?? ????? ??? ??? ??????? ??????.  ???? ?? ????? ?? ?? ????? ????? ?? ?? ?????? ??? ?????? ???? ??????? ????? DO 07/29/2023? 10:22 ??? PGY-1? ????? ??????? ??? ?????  It was great to see you today! Thank you for choosing [facility] for your primary care. Joyce Frye was seen for constipation vs hemorrhoid.  Today we addressed: Constipation - refilled miralax . On exam, no hemorrhoid seen, hydrocortisone ointment prescribed to apply to the area for itching  along with sitz baths Sitz bath: Fill with warm water: Add enough warm water to cover the perineal area, Soak for 15-20 minutes, and Clean and dry Pap smear performed today, will call with results Referral to GI for colonoscopy with hx of constipation and possible internal hemorrhoids.  I have referred you to GI to further evaluate your concern. If you have not received a phone call about this appointment within 3-4 weeks, please call our office back at [phone]. Rosanna Comment coordinates our referrals and can assist you in this.   If you haven't already, sign up for My Chart to have easy access to your labs results, and communication with your primary care physician.  We are checking some labs today. If they are abnormal, I will call you. If they are normal, I will send you a MyChart message (if it is active) or a letter in the mail. If you do not hear about your labs in the next 2 weeks, please call the office.  You should return to our clinic No follow-ups on file. Please arrive 15 minutes before your appointment to ensure smooth check in process.  We appreciate your efforts in making this happen.  Thank you for allowing me to participate in your care, Clyda Dark, DO 07/29/2023, 10:22 AM PGY-1,  [facility]

## 2023-07-29 NOTE — Progress Notes (Signed)
   SUBJECTIVE:   CHIEF COMPLAINT / HPI: painful hemorrhoid Patient presents to the visit with Dr. Rodell Citrin. Interpreter services was used.   Constipation - 4-5 days of constipation s/p running out of Miralax  which she was taking daily - Last bowel movement yesterday - No blood in stool, but she had to strain when using the bathroom and felt the hemorrhoid - Endorsed lower abdominal pain with the bowel movement as well - Endorses significant pain with her hemorrhoid as well  PERTINENT  PMH / PSH: Dyslipidemia, Anxiety  OBJECTIVE:   BP 100/70   Pulse 68   Ht 5\' 3"  (1.6 m)   Wt 150 lb (68 kg)   SpO2 98%   BMI 26.57 kg/m   General: Awake and Alert in NAD HEENT: NCAT. Sclera anicteric. No rhinorrhea. Cardiovascular: RRR. No M/R/G Respiratory: CTAB, normal WOB on RA. No wheezing, crackles, rhonchi, or diminished breath sounds. Abdomen: Soft, non-tender, non-distended. Bowel sounds normoactive Extremities: Able to move all extremities. No BLE edema, no deformities or significant joint findings. Skin: Warm and dry. No abrasions or rashes noted. Neuro: No focal neurological deficits. F GU: Normally developed genitalia with no external lesions or eruptions. Vagina and cervix show no lesions, inflammation, discharge or tenderness. Anus examined for hemorrhoids - none noted. Skin tags noted, but no fissures. Chaperoned by CMA Camilo Cella.   ASSESSMENT/PLAN:   Assessment & Plan Chronic constipation Patient endorses pain, irritation, and occasional itching with stools.  - Advised proper hygiene, sitz baths, and diet with fibrinous foods and adequate hydration  - Refilled Miralax  - Hydrocortisone 1% ointment as needed for itching/irritation for 1 week Cervical cancer screening Declined STI testing today. - Pap smear performed, will follow results - Follow-up as needed Colon cancer screening Previously had Cologard done in 2024, but with history of constipation and possible internal  hemorrhoids, referral made to GI for colonoscopy, female provider preferred.   Clyda Dark, DO [facility]

## 2023-07-30 ENCOUNTER — Ambulatory Visit: Admitting: Physical Therapy

## 2023-07-30 ENCOUNTER — Encounter: Payer: Self-pay | Admitting: Physical Therapy

## 2023-07-30 DIAGNOSIS — R2689 Other abnormalities of gait and mobility: Secondary | ICD-10-CM | POA: Diagnosis not present

## 2023-07-30 DIAGNOSIS — M6281 Muscle weakness (generalized): Secondary | ICD-10-CM | POA: Diagnosis not present

## 2023-07-30 DIAGNOSIS — M5459 Other low back pain: Secondary | ICD-10-CM

## 2023-07-30 NOTE — Therapy (Addendum)
 OUTPATIENT PHYSICAL THERAPY TREATMENT NOTE/DISCHARGE  PHYSICAL THERAPY DISCHARGE SUMMARY  Visits from Start of Care: 4  Current functional level related to goals / functional outcomes: See goals/objective   Remaining deficits: Unable to assess   Education / Equipment: HEP   Patient agrees to discharge. Patient goals were unable to assess. Patient is being discharged due to not returning since the last visit.     Patient Name: Joyce Frye MRN: [id number] DOB:10-16-1970, 53 y.o., female Today's Date: 07/30/2023  END OF SESSION:  PT End of Session - 07/30/23 1450     Visit Number 4    Number of Visits 17    Date for PT Re-Evaluation 08/28/23    Authorization Type [employer]    Authorization Time Period 07/08/23-09/05/23    Authorization - Visit Number 3    Authorization - Number of Visits 7    PT Start Time 0248    PT Stop Time 0330    PT Time Calculation (min) 42 min              Past Medical History:  Diagnosis Date   Cerebrovascular disease    Chronic headache    Strabismus    Stroke (HCC)    TB lung, latent    Past Surgical History:  Procedure Laterality Date   CHOLECYSTECTOMY     Patient Active Problem List   Diagnosis Date Noted   Lumbar radiculopathy, chronic 06/26/2023   Anxiety 07/05/2021   Paresthesia 09/29/2020   Depression 09/29/2020   Abnormal vision 09/29/2020   Lumbar back pain 08/21/2020   Dyslipidemia 05/28/2020   [facility] examination 04/19/2020   Strabismus 04/19/2020   TB lung, latent 04/19/2020    PCP: Adele Song, MD  REFERRING PROVIDER: Janet Lonni BRAVO, MD  REFERRING DIAG: [phone] (ICD-10-CM) - Lumbar arthropathy   Rationale for Evaluation and Treatment: Rehabilitation  THERAPY DIAG:  Other low back pain  Muscle weakness (generalized)  Other abnormalities of gait and mobility  ONSET DATE: Chronic  SUBJECTIVE:                                                                                                                                                                                            SUBJECTIVE STATEMENT: Pt reports she still has pain but it is better. 7/10 pain on arrival. Sometimes pain can be lower, 3-4/10.  Still reaches 9/10.    EVAL: Pt presents to PT with reports of chronic lower back pain that radiates down posterior thighs to 2nd toe on each side. Denies N/T, sensation down LE is just painful. Also denies bowel/bladder changes or saddle anesthesia. Is  not able to find positions of comfort. Is frustrated by continued pain and inability to improve comfort with ADLs over last few years. Recent MRI shows lumbar stenosis. Past therapy unsuccessful.   PERTINENT HISTORY:  CVA  PAIN:  Are you having pain?  Yes: NPRS scale: 7/10 Worst: 9/10 Pain location: lower back, posterior LE down to 2 toe Pain description: sharp, sore Aggravating factors: standing, walking Relieving factors: medication  PRECAUTIONS: None  RED FLAGS: None   WEIGHT BEARING RESTRICTIONS: No  FALLS:  Has patient fallen in last 6 months? No  LIVING ENVIRONMENT: Lives with: lives with their family Lives in: House/apartment Stairs: No Has following equipment at home: None  OCCUPATION: Not working  PLOF: Independent  PATIENT GOALS: decrease back pain, improve standing toelrance and comfort  NEXT MD VISIT: 07/31/2023  OBJECTIVE:  Note: Objective measures were completed at Evaluation unless otherwise noted.  DIAGNOSTIC FINDINGS:  See imaging for lumbar MRI  PATIENT SURVEYS:  ODI: 25/50 - 50% disability   COGNITION: Overall cognitive status: Within functional limits for tasks assessed     SENSATION: WFL  MUSCLE LENGTH: Thomas test: Right (+) deg; Left (+) deg  POSTURE: rounded shoulders, forward head, and increased lumbar lordosis  PALPATION: TTP to lumbar parapsinals  LUMBAR ROM:   AROM eval  Flexion 100%  Extension 25%  Right lateral flexion   Left lateral flexion    Right rotation 25%  Left rotation 25%   (Blank rows = not tested)  LOWER EXTREMITY MMT:    MMT Right eval Left eval  Hip flexion 3+/5 3+/5  Hip extension    Hip abduction 3/5 3/5  Hip adduction    Hip internal rotation    Hip external rotation    Knee flexion    Knee extension    Ankle dorsiflexion    Ankle plantarflexion    Ankle inversion    Ankle eversion     (Blank rows = not tested)  LUMBAR SPECIAL TESTS:  Straight leg raise test: Negative and Slump test: Negative  FUNCTIONAL TESTS:  30 Second Sit to Stand: 5 reps with UE  GAIT: Distance walked: 67ft Assistive device utilized: None Level of assistance: Complete Independence Comments: trunk flexed, decreased gait speed  TREATMENT: [facility] Adult PT Treatment:                                                DATE: 07/30/23 Nustep L4 Ue/LE x 5 minutes  Standing hip abduction 10 x 2  Standing march x 10 each - increased pain Bilat heel raise x10 Seated repeated lumbar flexion x 10 Seated clamshell 1 x 20 GTB  Seated March GTB x 10 each side  SKTC  LTR x 10 each  SLR x 10 each Hip abduction x 10 each in side lying     [facility] Adult PT Treatment:                                                DATE: 07/19/23 Nustep L4 Ue/LE x 5 minutes  Standing hip abduction 10 x 2  Standing march x 10 each - increased pain Bilat heel raise x10 Seated repeated lumbar flexion x 10 Seated clamshell 1 x 20 GTB  Seated  March GTB x 10 each side  STS x 10, hands on thighs  SKTC  LTR x 10 each  Supine marching  Seated h/s stretch  bilat Seated LAQ alternating    [facility] Adult PT Treatment:                                                DATE: 07/17/23  SKTC x 20 each Seated clamshell 1 x 20 GTB  Seated March GTB x 10 each side  Seated repeated lumbar flexion x 10 STS x 10 Standing hip abduction 10 x 2  Standing march x 10 each  SKTC  LTR x 10 each  Supine marching  Figure 4 QL stretch x 10 each  Double knee to chest   Updated HEP    [facility] Adult PT Treatment:                                                DATE: 07/03/2023 Therapeutic Exercise: SKTC x 20 each Seated clamshell x 15 GTB Seated repeated lumbar flexion x 10  PATIENT EDUCATION:  Education details: eval findings, ODI, HEP, POC Person educated: Patient Education method: Explanation, Demonstration, and Handouts Education comprehension: verbalized understanding and returned demonstration  HOME EXERCISE PROGRAM: Access Code: [email or web address] URL: [email or web address] Date: 07/03/2023 Prepared by: Alm Kingdom  Exercises - Supine Single Knee to Chest Stretch  - 1 x daily - 7 x weekly - 2-3 reps - 20 sec hold - Seated Hip Abduction with Resistance  - 1 x daily - 7 x weekly - 3 sets - 15 reps - green band hold - Seated Lumbar Flexion Stretch  - 1 x daily - 7 x weekly - 2 sets - 10 reps - 5 sec hold 07/17/23:  Hip abduction standing 10 x 2 each   ASSESSMENT:  CLINICAL IMPRESSION: Pt arrives for 3rd treatment. Pt reports compliance with HEP and continued pain. Continued Aerobic machine for activity tolerance and reviewed entire HEP. Pt will see MD for follow up tomorrow.  Due to lack of progress pt may opt to DC PT after seeing MD.  Pt reports she tried walking 1 hour a week ago and had terrible pain in back and legs afterward. Discussed walking program for 5 minutes 1-2 x per day to work up to a longer walk. She reports that her clam shell exercise helps her pain. Continues to experience 9/10 pain. Does not complete much activity other than cooking at this time. Has increased pain with attempts at closed chain activity in clinic. Discussed ongoing PT to build strength. Pt verbalizes understanding via interpreter.     EVAL: Patient is a 53 y.o. F who was seen today for physical therapy evaluation and treatment for chronic LBP with referral into bilateral LE. Physical findings are consistent with MD impression as pt demonstrates decrease  in core/hip strength and functional mobility. ODI score indicates severe disability in performance of home ADLs and community activities. Pt would benefit from skilled PT services working on flexion based stretching and core strengthening in order to decrease pain and improve comfort.    OBJECTIVE IMPAIRMENTS: Abnormal gait, decreased activity tolerance, decreased endurance, decreased mobility, difficulty walking, decreased ROM, decreased strength, postural dysfunction, and pain  ACTIVITY  LIMITATIONS: carrying, lifting, standing, squatting, stairs, transfers, bed mobility, and locomotion level  PARTICIPATION LIMITATIONS: meal prep, cleaning, driving, shopping, community activity, and yard work  PERSONAL FACTORS: Time since onset of injury/illness/exacerbation and 1-2 comorbidities: CVA are also affecting patient's functional outcome.   REHAB POTENTIAL: Good  CLINICAL DECISION MAKING: Evolving/moderate complexity  EVALUATION COMPLEXITY: Moderate   GOALS: Goals reviewed with patient? No  SHORT TERM GOALS: Target date: 07/24/2023   Pt will be compliant and knowledgeable with initial HEP for improved comfort and carryover Baseline: initial HEP given  Goal status: MET  2.  Pt will self report low back and LE pain no greater than 6/10 for improved comfort and functional ability Baseline: 9/10 at worst 07/18/23: 9/10 at worst still   Goal status: ONGOING  LONG TERM GOALS: Target date: 08/28/2023   Pt will be decrease ODI disability score to no greater than 35% (17/50) as proxy for functional improvement Baseline: 50% disability (25/50) Goal status: INITIAL  2.  Pt will self report low back and LE pain no greater than 3/10 for improved comfort and functional ability Baseline: 9/10 at worst Goal status: INITIAL   3.  Pt will increase 30 Second Sit to Stand rep count to no less than 9 reps for improved balance, strength, and functional mobility Baseline: 5 reps with UE  Goal status:  INITIAL   4.  Pt will improve bilateral LE strength to no less than 4/5 for improved functional mobility and decrease pain Baseline: see MMT chart Goal status: INITIAL  5.  Pt will improve standing activity tolerance to at least 20 minutes not limited by pain for improved comfort and functional ability with home ADLs Baseline: unable Goal status: INITIAL   PLAN:  PT FREQUENCY: 1-2x/week  PT DURATION: 8 weeks  PLANNED INTERVENTIONS: 97164- PT Re-evaluation, 97110-Therapeutic exercises, 97530- Therapeutic activity, [id number]- Neuromuscular re-education, 97535- Self Care, [id number]- Manual therapy, [id number]- Gait training, H9716- Electrical stimulation (unattended), [id number]- Electrical stimulation (manual), Dry Needling, Cryotherapy, and Moist heat  PLAN FOR NEXT SESSION: neutral spine core and hip strengthening, lumbar flexion based stretching, hip flexor stretching  For all possible CPT codes, reference the Planned Interventions line above.     Check all conditions that are expected to impact treatment: {Conditions expected to impact treatment:Musculoskeletal disorders   If treatment provided at initial evaluation, no treatment charged due to lack of authorization.       Harlene Persons, PTA 07/30/23 3:30 PM Phone: [phone] Fax: [phone]

## 2023-07-31 ENCOUNTER — Encounter: Payer: Self-pay | Admitting: Family Medicine

## 2023-07-31 ENCOUNTER — Ambulatory Visit: Payer: Self-pay | Admitting: Family Medicine

## 2023-07-31 ENCOUNTER — Ambulatory Visit: Admitting: Family Medicine

## 2023-07-31 VITALS — BP 105/59 | Ht 63.0 in | Wt 153.0 lb

## 2023-07-31 DIAGNOSIS — M5416 Radiculopathy, lumbar region: Secondary | ICD-10-CM

## 2023-07-31 LAB — CYTOLOGY - PAP
Comment: NEGATIVE
Diagnosis: NEGATIVE
High risk HPV: NEGATIVE

## 2023-07-31 NOTE — Assessment & Plan Note (Signed)
-   The patient reports improvement with PT and after discussing other options for evaluation and treatment she would like to continue this.  - She will follow up in 1 month for reevaluation. If no further improvement at that point we can put in a referral to orthopedic office for possible facet injections as I believe this is largely contributory.

## 2023-07-31 NOTE — Progress Notes (Signed)
 Joyce Frye - 53 y.o. female MRN [id number]  Date of birth: 1971/01/23  PCP: Naida Austria, MD  Subjective:  No chief complaint on file.  Back pain  HPI: Past Medical, Surgical, Social, and Family History Reviewed & Updated per EMR.   Patient is a 53 y.o. female here for follow up on lumbar pain with bilateral radiculopathy. Her pain in all areas has slightly improved with PT but has not responded to topical treatments or oral medication. She does not want to try any further medications but finds PT very beneficial and would like to continue this. There has been no new pain, numbness, or weakness, fevers, or LOBB.  Past Medical History:  Diagnosis Date   Cerebrovascular disease    Chronic headache    Strabismus    Stroke (HCC)    TB lung, latent     Current Outpatient Medications on File Prior to Visit  Medication Sig Dispense Refill   gabapentin  (NEURONTIN ) 300 MG capsule Take 1 capsule (300 mg total) by mouth at bedtime. 30 capsule 1   hydrocortisone 1 % ointment Apply 1 Application topically daily as needed for itching. 28 g 0   hydrOXYzine  (ATARAX ) 10 MG tablet Take 1 tablet (10 mg total) by mouth 3 (three) times daily as needed for anxiety. 30 tablet 0   mirtazapine  (REMERON ) 7.5 MG tablet TAKE 1 TABLET(7.5 MG) BY MOUTH AT BEDTIME 90 tablet 1   ondansetron  (ZOFRAN -ODT) 4 MG disintegrating tablet Take 1 tablet (4 mg total) by mouth every 8 (eight) hours as needed for nausea or vomiting. (Patient not taking: Reported on 07/30/2022) 20 tablet 0   polyethylene glycol powder (GLYCOLAX /MIRALAX ) 17 GM/SCOOP powder Take 17 g by mouth daily. MIX AND DRINK 17G BY MOUTH EVERY DAY. 850 g 3   rosuvastatin  (CRESTOR ) 10 MG tablet Take 1 tablet (10 mg total) by mouth daily. 90 tablet 3   No current facility-administered medications on file prior to visit.    Past Surgical History:  Procedure Laterality Date   CHOLECYSTECTOMY      Allergies  Allergen Reactions   Paracetamol [Acetaminophen]      Tremors         Objective:  Physical Exam: VS: BP:(!) 105/59  HR: bpm  TEMP: ( )  RESP:   HT:5\' 3"  (160 cm)   WT:153 lb (69.4 kg)  BMI:27.11  Gen: NAD, speaks clearly, comfortable in exam room Respiratory: Normal respiratory effort on room air. No signs of distress Skin: No rashes, abrasions, or ecchymosis MSK:  Back Exam:  Inspection: Unremarkable except for some loss of lordosis Motion: Flexion 45 deg, Extension 45 deg, Side Bending to 45 deg bilaterally, Rotation to 45 deg bilaterally, nothing elicits pain SLR laying: Negative  XSLR laying: Negative  Palpable tenderness: mildly at the L4-S1 paraspinal musculature on the L>R FABER: neg Sensory change: Gross sensation intact to all lumbar and sacral dermatomes.  Reflexes: 2+ at both patellar tendons, 2+ at achilles tendons, Babinski's downgoing.  Gait: normal Sit to stand: normal   Assessment & Plan:   Lumbar radiculopathy, chronic - The patient reports improvement with PT and after discussing other options for evaluation and treatment she would like to continue this.  - She will follow up in 1 month for reevaluation. If no further improvement at that point we can put in a referral to orthopedic office for possible facet injections as I believe this is largely contributory.     Berneda Bridges MD [facility] Fellow

## 2023-08-02 ENCOUNTER — Telehealth: Payer: Self-pay | Admitting: Physical Therapy

## 2023-08-02 ENCOUNTER — Ambulatory Visit: Admitting: Physical Therapy

## 2023-08-02 NOTE — Telephone Encounter (Signed)
 In person interpreter used to contact patient regarding no show. Confirmed next appointment date/ time. Also scheduled 2 more appointments while son was able to assist.

## 2023-08-05 ENCOUNTER — Telehealth: Payer: Self-pay | Admitting: Physical Therapy

## 2023-08-05 ENCOUNTER — Ambulatory Visit: Admitting: Physical Therapy

## 2023-08-05 NOTE — Telephone Encounter (Signed)
 Spoke with patient's son after missed appointment. He reports that his mother was not feeling well and he forgot to call and cancel the appointment. Confirmed next dates.

## 2023-08-09 ENCOUNTER — Ambulatory Visit: Admitting: Physical Therapy

## 2023-08-20 ENCOUNTER — Telehealth: Payer: Self-pay | Admitting: Physical Therapy

## 2023-08-20 ENCOUNTER — Encounter: Payer: Self-pay | Admitting: Family Medicine

## 2023-08-20 ENCOUNTER — Ambulatory Visit: Payer: Self-pay | Admitting: Physical Therapy

## 2023-08-20 NOTE — Telephone Encounter (Signed)
 called pt to make her aware about cancelling her appt. due to provider being out. pt did not answer and her vm has not been set up. i also called her son 2x and he did not pick up and vm was full.

## 2023-08-27 ENCOUNTER — Ambulatory Visit: Payer: Self-pay | Admitting: Physical Therapy

## 2023-08-27 NOTE — Therapy (Incomplete)
 OUTPATIENT PHYSICAL THERAPY THORACOLUMBAR TREATMENT    Patient Name: Joyce Frye MRN: [id number] DOB:1970-04-04, 53 y.o., female Today's Date: 08/27/2023  END OF SESSION:     Past Medical History:  Diagnosis Date   Cerebrovascular disease    Chronic headache    Strabismus    Stroke (HCC)    TB lung, latent    Past Surgical History:  Procedure Laterality Date   CHOLECYSTECTOMY     Patient Active Problem List   Diagnosis Date Noted   Lumbar radiculopathy, chronic 06/26/2023   Anxiety 07/05/2021   Paresthesia 09/29/2020   Depression 09/29/2020   Abnormal vision 09/29/2020   Lumbar back pain 08/21/2020   Dyslipidemia 05/28/2020   [facility] examination 04/19/2020   Strabismus 04/19/2020   TB lung, latent 04/19/2020    PCP: Naida Austria, MD  REFERRING PROVIDER: Claybon Cuna, MD  REFERRING DIAG: [phone] (ICD-10-CM) - Lumbar arthropathy   Rationale for Evaluation and Treatment: Rehabilitation  THERAPY DIAG:  No diagnosis found.  ONSET DATE: Chronic  SUBJECTIVE:                                                                                                                                                                                           SUBJECTIVE STATEMENT: Pt reports she still has pain but it is better. 7/10 pain on arrival. Sometimes pain can be lower, 3-4/10.  Still reaches 9/10.    EVAL: Pt presents to PT with reports of chronic lower back pain that radiates down posterior thighs to 2nd toe on each side. Denies N/T, sensation down LE is just painful. Also denies bowel/bladder changes or saddle anesthesia. Is not able to find positions of comfort. Is frustrated by continued pain and inability to improve comfort with ADLs over last few years. Recent MRI shows lumbar stenosis. Past therapy unsuccessful.   PERTINENT HISTORY:  CVA  PAIN:  Are you having pain?  Yes: NPRS scale: 7/10 Worst: 9/10 Pain location: lower back, posterior LE down  to 2 toe Pain description: sharp, sore Aggravating factors: standing, walking Relieving factors: medication  PRECAUTIONS: None  RED FLAGS: None   WEIGHT BEARING RESTRICTIONS: No  FALLS:  Has patient fallen in last 6 months? No  LIVING ENVIRONMENT: Lives with: lives with their family Lives in: House/apartment Stairs: No Has following equipment at home: None  OCCUPATION: Not working  PLOF: Independent  PATIENT GOALS: decrease back pain, improve standing toelrance and comfort  NEXT MD VISIT: 07/31/2023  OBJECTIVE:  Note: Objective measures were completed at Evaluation unless otherwise noted.  DIAGNOSTIC FINDINGS:  See imaging for lumbar MRI  PATIENT SURVEYS:  ODI: 25/50 -  50% disability   COGNITION: Overall cognitive status: Within functional limits for tasks assessed     SENSATION: WFL  MUSCLE LENGTH: Thomas test: Right (+) deg; Left (+) deg  POSTURE: rounded shoulders, forward head, and increased lumbar lordosis  PALPATION: TTP to lumbar parapsinals  LUMBAR ROM:   AROM eval  Flexion 100%  Extension 25%  Right lateral flexion   Left lateral flexion   Right rotation 25%  Left rotation 25%   (Blank rows = not tested)  LOWER EXTREMITY MMT:    MMT Right eval Left eval  Hip flexion 3+/5 3+/5  Hip extension    Hip abduction 3/5 3/5  Hip adduction    Hip internal rotation    Hip external rotation    Knee flexion    Knee extension    Ankle dorsiflexion    Ankle plantarflexion    Ankle inversion    Ankle eversion     (Blank rows = not tested)  LUMBAR SPECIAL TESTS:  Straight leg raise test: Negative and Slump test: Negative  FUNCTIONAL TESTS:  30 Second Sit to Stand: 5 reps with UE  GAIT: Distance walked: 19ft Assistive device utilized: None Level of assistance: Complete Independence Comments: trunk flexed, decreased gait speed  TREATMENT:  [facility] Adult PT Treatment:                                                DATE:  08-27-23 Therapeutic Exercise: *** Manual Therapy: *** Neuromuscular re-ed: *** Therapeutic Activity: *** Modalities: *** Self Care: ***  [facility] Adult PT Treatment:                                                DATE: 07/30/23 Nustep L4 Ue/LE x 5 minutes  Standing hip abduction 10 x 2  Standing march x 10 each - increased pain Bilat heel raise x10 Seated repeated lumbar flexion x 10 Seated clamshell 1 x 20 GTB  Seated March GTB x 10 each side  SKTC  LTR x 10 each  SLR x 10 each Hip abduction x 10 each in side lying     [facility] Adult PT Treatment:                                                DATE: 07/19/23 Nustep L4 Ue/LE x 5 minutes  Standing hip abduction 10 x 2  Standing march x 10 each - increased pain Bilat heel raise x10 Seated repeated lumbar flexion x 10 Seated clamshell 1 x 20 GTB  Seated March GTB x 10 each side  STS x 10, hands on thighs  SKTC  LTR x 10 each  Supine marching  Seated h/s stretch  bilat Seated LAQ alternating    [facility] Adult PT Treatment:                                                DATE: 07/17/23  SKTC x 20" each Seated clamshell 1  x 20 GTB  Seated March GTB x 10 each side  Seated repeated lumbar flexion x 10 STS x 10 Standing hip abduction 10 x 2  Standing march x 10 each  SKTC  LTR x 10 each  Supine marching  Figure 4 QL stretch x 10 each  Double knee to chest  Updated HEP    [facility] Adult PT Treatment:                                                DATE: 07/03/2023 Therapeutic Exercise: SKTC x 20" each Seated clamshell x 15 GTB Seated repeated lumbar flexion x 10  PATIENT EDUCATION:  Education details: eval findings, ODI, HEP, POC Person educated: Patient Education method: Explanation, Demonstration, and Handouts Education comprehension: verbalized understanding and returned demonstration  HOME EXERCISE PROGRAM: Access Code: [email or web address] URL: [email or web address] Date: 07/03/2023 Prepared by: Loral Roch  Exercises - Supine Single Knee to Chest Stretch  - 1 x daily - 7 x weekly - 2-3 reps - 20 sec hold - Seated Hip Abduction with Resistance  - 1 x daily - 7 x weekly - 3 sets - 15 reps - green band hold - Seated Lumbar Flexion Stretch  - 1 x daily - 7 x weekly - 2 sets - 10 reps - 5 sec hold 07/17/23:  Hip abduction standing 10 x 2 each   ASSESSMENT:  CLINICAL IMPRESSION: Pt arrives for 3rd treatment. Pt reports compliance with HEP and continued pain. Continued Aerobic machine for activity tolerance and reviewed entire HEP. Pt will see MD for follow up tomorrow.  Due to lack of progress pt may opt to DC PT after seeing MD.  Pt reports she tried walking 1 hour a week ago and had terrible pain in back and legs afterward. Discussed walking program for 5 minutes 1-2 x per day to work up to a longer walk. She reports that her clam shell exercise helps her pain. Continues to experience 9/10 pain. Does not complete much activity other than cooking at this time. Has increased pain with attempts at closed chain activity in clinic. Discussed ongoing PT to build strength. Pt verbalizes understanding via interpreter.     EVAL: Patient is a 53 y.o. F who was seen today for physical therapy evaluation and treatment for chronic LBP with referral into bilateral LE. Physical findings are consistent with MD impression as pt demonstrates decrease in core/hip strength and functional mobility. ODI score indicates severe disability in performance of home ADLs and community activities. Pt would benefit from skilled PT services working on flexion based stretching and core strengthening in order to decrease pain and improve comfort.    OBJECTIVE IMPAIRMENTS: Abnormal gait, decreased activity tolerance, decreased endurance, decreased mobility, difficulty walking, decreased ROM, decreased strength, postural dysfunction, and pain  ACTIVITY LIMITATIONS: carrying, lifting, standing, squatting, stairs, transfers, bed  mobility, and locomotion level  PARTICIPATION LIMITATIONS: meal prep, cleaning, driving, shopping, community activity, and yard work  PERSONAL FACTORS: Time since onset of injury/illness/exacerbation and 1-2 comorbidities: CVA are also affecting patient's functional outcome.   REHAB POTENTIAL: Good  CLINICAL DECISION MAKING: Evolving/moderate complexity  EVALUATION COMPLEXITY: Moderate   GOALS: Goals reviewed with patient? No  SHORT TERM GOALS: Target date: 07/24/2023   Pt will be compliant and knowledgeable with initial HEP for improved comfort and carryover Baseline: initial HEP given  Goal status:  MET  2.  Pt will self report low back and LE pain no greater than 6/10 for improved comfort and functional ability Baseline: 9/10 at worst 07/18/23: 9/10 at worst still   Goal status: ONGOING  LONG TERM GOALS: Target date: 08/28/2023   Pt will be decrease ODI disability score to no greater than 35% (17/50) as proxy for functional improvement Baseline: 50% disability (25/50) Goal status: INITIAL  2.  Pt will self report low back and LE pain no greater than 3/10 for improved comfort and functional ability Baseline: 9/10 at worst Goal status: INITIAL   3.  Pt will increase 30 Second Sit to Stand rep count to no less than 9 reps for improved balance, strength, and functional mobility Baseline: 5 reps with UE  Goal status: INITIAL   4.  Pt will improve bilateral LE strength to no less than 4/5 for improved functional mobility and decrease pain Baseline: see MMT chart Goal status: INITIAL  5.  Pt will improve standing activity tolerance to at least 20 minutes not limited by pain for improved comfort and functional ability with home ADLs Baseline: unable Goal status: INITIAL   PLAN:  PT FREQUENCY: 1-2x/week  PT DURATION: 8 weeks  PLANNED INTERVENTIONS: 97164- PT Re-evaluation, 97110-Therapeutic exercises, 97530- Therapeutic activity, [id number]- Neuromuscular re-education, 97535-  Self Care, 16109- Manual therapy, [id number]- Gait training, U0454- Electrical stimulation (unattended), [id number]- Electrical stimulation (manual), Dry Needling, Cryotherapy, and Moist heat  PLAN FOR NEXT SESSION: neutral spine core and hip strengthening, lumbar flexion based stretching, hip flexor stretching  For all possible CPT codes, reference the Planned Interventions line above.     Check all conditions that are expected to impact treatment: {Conditions expected to impact treatment:Musculoskeletal disorders   If treatment provided at initial evaluation, no treatment charged due to lack of authorization.       ***

## 2023-08-28 ENCOUNTER — Ambulatory Visit: Admitting: Family Medicine

## 2023-08-30 ENCOUNTER — Other Ambulatory Visit: Payer: Self-pay

## 2023-08-30 ENCOUNTER — Encounter (HOSPITAL_COMMUNITY): Payer: Self-pay

## 2023-08-30 ENCOUNTER — Emergency Department (HOSPITAL_COMMUNITY)

## 2023-08-30 ENCOUNTER — Emergency Department (HOSPITAL_COMMUNITY)
Admission: EM | Admit: 2023-08-30 | Discharge: 2023-08-30 | Disposition: A | Discharge status: Home / Self Care | Attending: Emergency Medicine | Admitting: Emergency Medicine

## 2023-08-30 DIAGNOSIS — R202 Paresthesia of skin: Secondary | ICD-10-CM | POA: Diagnosis not present

## 2023-08-30 DIAGNOSIS — R200 Anesthesia of skin: Secondary | ICD-10-CM

## 2023-08-30 DIAGNOSIS — R0602 Shortness of breath: Secondary | ICD-10-CM | POA: Diagnosis not present

## 2023-08-30 LAB — CBC WITH DIFFERENTIAL/PLATELET
Abs Immature Granulocytes: 0.01 10*3/uL (ref 0.00–0.07)
Basophils Absolute: 0 10*3/uL (ref 0.0–0.1)
Basophils Relative: 1 %
Eosinophils Absolute: 0 10*3/uL (ref 0.0–0.5)
Eosinophils Relative: 0 %
HCT: 39.4 % (ref 36.0–46.0)
Hemoglobin: 12.8 g/dL (ref 12.0–15.0)
Immature Granulocytes: 0 %
Lymphocytes Relative: 45 %
Lymphs Abs: 2.5 10*3/uL (ref 0.7–4.0)
MCH: 29 pg (ref 26.0–34.0)
MCHC: 32.5 g/dL (ref 30.0–36.0)
MCV: 89.3 fL (ref 80.0–100.0)
Monocytes Absolute: 0.3 10*3/uL (ref 0.1–1.0)
Monocytes Relative: 5 %
Neutro Abs: 2.7 10*3/uL (ref 1.7–7.7)
Neutrophils Relative %: 49 %
Platelets: 243 10*3/uL (ref 150–400)
RBC: 4.41 MIL/uL (ref 3.87–5.11)
RDW: 12.2 % (ref 11.5–15.5)
WBC: 5.5 10*3/uL (ref 4.0–10.5)
nRBC: 0 % (ref 0.0–0.2)

## 2023-08-30 LAB — BASIC METABOLIC PANEL WITH GFR
Anion gap: 11 (ref 5–15)
BUN: 14 mg/dL (ref 6–20)
CO2: 24 mmol/L (ref 22–32)
Calcium: 10.3 mg/dL (ref 8.9–10.3)
Chloride: 106 mmol/L (ref 98–111)
Creatinine, Ser: 0.64 mg/dL (ref 0.44–1.00)
GFR, Estimated: >60 mL/min (ref >60)
Glucose, Bld: 95 mg/dL (ref 70–99)
Potassium: 3.9 mmol/L (ref 3.5–5.1)
Sodium: 141 mmol/L (ref 135–145)

## 2023-08-30 NOTE — ED Triage Notes (Signed)
 Patient c/o HTN and tingling feet bilaterally this week and feeling like she wants to cry. Son reports BP are 130,140 systolic. Patient reports that she is stressed out at home as well.

## 2023-08-30 NOTE — Discharge Instructions (Signed)
 You were seen in the emergency department for some numbness in both of your feet and feeling short of breath.  Your lab work and x-ray were unremarkable.  Please continue your regular medications and follow-up with your primary care doctor.

## 2023-08-30 NOTE — ED Notes (Signed)
 Patient transported to X-ray

## 2023-08-30 NOTE — ED Provider Notes (Signed)
 [place] EMERGENCY DEPARTMENT AT [facility] Provider Note   CSN: [id number] Arrival date & time: 08/30/23  1225     Patient presents with: Numbness   Joyce Frye is a 53 y.o. female.  She is brought in by her son.  She is complaining of 2 days of numbness of both of her feet and legs.  She also feels a little short of breath.  She is under a lot of stress with her son leaving to go back to [place].  She is not sleeping well and she is thinking a lot.  She has a primary care doctor but does not see anybody for mental health.  She is originally from [place] and speaks Dari, iPad interpreter [id number] was used.  She denies any chest pain abdominal pain vomiting diarrhea.  No trauma.  {Add pertinent medical, surgical, social history, OB history to GEX:52841} The history is provided by the patient. The history is limited by a language barrier. A language interpreter was used ([id number]).       Prior to Admission medications   Medication Sig Start Date End Date Taking? Authorizing Provider  gabapentin  (NEURONTIN ) 300 MG capsule Take 1 capsule (300 mg total) by mouth at bedtime. 06/26/23   Claybon Cuna, MD  hydrocortisone  1 % ointment Apply 1 Application topically daily as needed for itching. 07/29/23   Gomes, Adriana, DO  hydrOXYzine  (ATARAX ) 10 MG tablet Take 1 tablet (10 mg total) by mouth 3 (three) times daily as needed for anxiety. 03/19/22   Debbra Fairy, PA-C  mirtazapine  (REMERON ) 7.5 MG tablet TAKE 1 TABLET(7.5 MG) BY MOUTH AT BEDTIME 07/23/23   Hindel, Leah, MD  ondansetron  (ZOFRAN -ODT) 4 MG disintegrating tablet Take 1 tablet (4 mg total) by mouth every 8 (eight) hours as needed for nausea or vomiting. Patient not taking: Reported on 07/30/2022 03/26/22   Tegeler, Marine Sia, MD  polyethylene glycol powder (GLYCOLAX /MIRALAX ) 17 GM/SCOOP powder Take 17 g by mouth daily. MIX AND DRINK 17G BY MOUTH EVERY DAY. 07/29/23   Gomes, Adriana, DO  rosuvastatin  (CRESTOR ) 10 MG tablet  Take 1 tablet (10 mg total) by mouth daily. 04/27/22   Lilland, Alana, DO    Allergies: Paracetamol [acetaminophen]    Review of Systems  Constitutional:  Negative for fever.  HENT:  Negative for sore throat.   Respiratory:  Positive for shortness of breath.   Cardiovascular:  Negative for chest pain.  Gastrointestinal:  Negative for abdominal pain.  Genitourinary:  Negative for dysuria.  Skin:  Negative for rash.  Neurological:  Positive for numbness.    Updated Vital Signs BP 131/82 (BP Location: Right Arm)   Pulse 71   Temp 98.3 F (36.8 C) (Oral)   Resp (!) 22   SpO2 100%   Physical Exam Vitals and nursing note reviewed.  Constitutional:      General: She is not in acute distress.    Appearance: Normal appearance. She is well-developed.  HENT:     Head: Normocephalic and atraumatic.   Eyes:     Conjunctiva/sclera: Conjunctivae normal.    Cardiovascular:     Rate and Rhythm: Normal rate and regular rhythm.     Heart sounds: No murmur heard. Pulmonary:     Effort: Pulmonary effort is normal. No respiratory distress.     Breath sounds: Normal breath sounds. No stridor. No wheezing.  Abdominal:     Palpations: Abdomen is soft.     Tenderness: There is no abdominal tenderness. There is no  guarding or rebound.   Musculoskeletal:        General: No tenderness or deformity. Normal range of motion.     Cervical back: Neck supple.     Comments: She has strong distal pulses in both lower extremities   Skin:    General: Skin is warm and dry.   Neurological:     General: No focal deficit present.     Mental Status: She is alert.     GCS: GCS eye subscore is 4. GCS verbal subscore is 5. GCS motor subscore is 6.     Sensory: No sensory deficit.     Motor: No weakness.     Gait: Gait normal.     (all labs ordered are listed, but only abnormal results are displayed) Labs Reviewed - No data to display  EKG: None  Radiology: No results found.  {Document cardiac  monitor, telemetry assessment procedure when appropriate:32947} Procedures   Medications Ordered in the ED - No data to display    {Click here for ABCD2, HEART and other calculators REFRESH Note before signing:1}                              Medical Decision Making Amount and/or Complexity of Data Reviewed Labs: ordered. Radiology: ordered.   This patient complains of ***; this involves an extensive number of treatment Options and is a complaint that carries with it a high risk of complications and morbidity. The differential includes ***  I ordered, reviewed and interpreted labs, which included *** I ordered medication *** and reviewed PMP when indicated. I ordered imaging studies which included *** and I independently    visualized and interpreted imaging which showed *** Additional history obtained from *** Previous records obtained and reviewed *** I consulted *** and discussed lab and imaging findings and discussed disposition.  Cardiac monitoring reviewed, *** Social determinants considered, *** Critical Interventions: ***  After the interventions stated above, I reevaluated the patient and found *** Admission and further testing considered, ***   {Document critical care time when appropriate  Document review of labs and clinical decision tools ie CHADS2VASC2, etc  Document your independent review of radiology images and any outside records  Document your discussion with family members, caretakers and with consultants  Document social determinants of health affecting pt's care  Document your decision making why or why not admission, treatments were needed:32947:::1}   Final diagnoses:  None    ED Discharge Orders     None

## 2023-09-03 ENCOUNTER — Encounter: Payer: Self-pay | Admitting: Family Medicine

## 2023-09-03 ENCOUNTER — Ambulatory Visit: Admitting: Family Medicine

## 2023-09-03 VITALS — BP 110/72 | HR 69 | Ht 63.0 in | Wt 149.2 lb

## 2023-09-03 DIAGNOSIS — F321 Major depressive disorder, single episode, moderate: Secondary | ICD-10-CM | POA: Diagnosis not present

## 2023-09-03 DIAGNOSIS — E785 Hyperlipidemia, unspecified: Secondary | ICD-10-CM

## 2023-09-03 DIAGNOSIS — M545 Low back pain, unspecified: Secondary | ICD-10-CM | POA: Diagnosis not present

## 2023-09-03 DIAGNOSIS — R202 Paresthesia of skin: Secondary | ICD-10-CM | POA: Diagnosis not present

## 2023-09-03 MED ORDER — IBUPROFEN 400 MG PO TABS
400.0000 mg | ORAL_TABLET | Freq: Four times a day (QID) | ORAL | 0 refills | Status: AC | PRN
Start: 2023-09-03 — End: ?

## 2023-09-03 NOTE — Assessment & Plan Note (Signed)
 Controlled at this time.  - Continue ibuprofen as needed, counseled to take with food and avoid overuse  - Continue PT exercises daily  - Follow up with sports medicine if ever worsens (plan for worsening is facet injections)

## 2023-09-03 NOTE — Progress Notes (Signed)
 SUBJECTIVE:   CHIEF COMPLAINT / HPI:   Depression  Patient's son was visiting from [place] after not seeing her for 10 years. He left about 3 days ago and patient has been feeling very upset and has been crying continuously. Patient's daughter says that patient is very sad and has been tearful doing all of her daily activities. She has been stable on her current dose of Mirtazapine  otherwise.  Tried therapy once when she first came to [place] about 8 years ago. She had one session and felt like it did not work. She is not interested in therapy at this time, but was wanting a medication to make her feel better. She denies SI,SH.    Tingling in feet  Patient reports that she has had tingling in her feet for the past 3 days. She recently went to the ED for this issue. She says this feels a little better today since she went to the ED. She did not receive any treatment. She is not on gabapentin . Patient also mentioned that her fingers and toes have been feeling cold at the tips. Denies fatigue or other cold intolerance. Denies pain. Says that the tingling only happens when she is very sad.   Patient says her radiculopathy symptoms are different from this and present mainly as back pain. She takes ibuprofen a couple times a week for this pain and does PT exercises daily which have kept the symptoms stable.   Dyslipidemia  Patient has been taking rosuvastatin  daily without any notable side effects.   PERTINENT  PMH / PSH: Depression, Lumbar radiculopathy   OBJECTIVE:   BP 110/72   Pulse 69   Ht 5' 3 (1.6 m)   Wt 149 lb 3.2 oz (67.7 kg)   SpO2 99%   BMI 26.43 kg/m   General: tearful, in no acute distress Neck: no lymphadenopathy, thyroid normal size, no abnormal masses or contours  CV: RRR, radial pulses equal and palpable, dorsal pedal and posterior tibial pulses equal and palpable, no BLE edema, cap refill < 2 seconds   Resp: Normal work of breathing on room air, CTAB Neuro: Alert &  Oriented x 4, sensation to touch normal in hands and feet, no clonus, reflexes normal    ASSESSMENT/PLAN:   Paresthesia Assessment & Plan: Tingling and feet not concerning as is not constant. Patient has good blood flow. Previous A1c normal. No other evidence of B12 deficiency and does not have risk factors. No other evidence of thyroid dysfunction. Given that symptoms only occur during emotional episodes could be a psychosomatic manifestation. Does not seem to be related to lumbar radiculopathy.  - Follow up in 4 weeks    Dyslipidemia Assessment & Plan: Continue rosuvastatin  as patient has no side effects.   Orders: -     Lipid panel  Lumbar back pain Assessment & Plan: Controlled at this time.  - Continue ibuprofen as needed, counseled to take with food and avoid overuse  - Continue PT exercises daily  - Follow up with sports medicine if ever worsens (plan for worsening is facet injections)   Orders: -     Ibuprofen; Take 1 tablet (400 mg total) by mouth every 6 (six) hours as needed for moderate pain (pain score 4-6).  Dispense: 30 tablet; Refill: 0  Current moderate episode of major depressive disorder, unspecified whether recurrent [facility]) Assessment & Plan: Currently having an episode of depression given her son leaving the country after visiting. Though patient is having an acute  exacerbation denies SI. Has been stable on mirtazapine  and is controlling depression otherwise.  - Patient declined psychotherapy   - Discussed coping strategies  - Continue 15 mg Mirtazapine   - Follow up in 4 weeks.        Santa Cuba, MD [facility]

## 2023-09-03 NOTE — Assessment & Plan Note (Signed)
 Tingling and feet not concerning as is not constant. Patient has good blood flow. Previous A1c normal. No other evidence of B12 deficiency and does not have risk factors. No other evidence of thyroid dysfunction. Given that symptoms only occur during emotional episodes could be a psychosomatic manifestation. Does not seem to be related to lumbar radiculopathy.  - Follow up in 4 weeks

## 2023-09-03 NOTE — Patient Instructions (Signed)
 It was wonderful to see you today.  Please bring ALL of your medications with you to every visit.   Today we talked about:  Tingling - This is most likely a response to your mental state and how these emotions show up in our body. I did not see anything concerning on my exam, so we will hold off on labs for now.   Depression - I am sorry these symptoms are worse at this time. If you are not interested in therapy please try to continue your routine like doing walks, seeking comfort in your family. Continue your current dose of your mirtazapine .   Back pain - I have sent ibuprofen to the pharmacy. Continue your exercises.   Please follow up in 1 month   Thank you for choosing [facility].   Please call [phone] with any questions about today's appointment.  Please be sure to schedule follow up at the front desk before you leave today.   Santa Cuba, MD  Family Medicine

## 2023-09-03 NOTE — Assessment & Plan Note (Signed)
 Continue rosuvastatin  as patient has no side effects.

## 2023-09-03 NOTE — Assessment & Plan Note (Signed)
 Currently having an episode of depression given her son leaving the country after visiting. Though patient is having an acute exacerbation denies SI. Has been stable on mirtazapine  and is controlling depression otherwise.  - Patient declined psychotherapy   - Discussed coping strategies  - Continue 15 mg Mirtazapine   - Follow up in 4 weeks.

## 2023-09-04 ENCOUNTER — Ambulatory Visit: Payer: Self-pay | Admitting: Family Medicine

## 2023-09-04 LAB — LIPID PANEL
Chol/HDL Ratio: 2.6 ratio (ref 0.0–4.4)
Cholesterol, Total: 135 mg/dL (ref 100–199)
HDL: 52 mg/dL (ref >39)
LDL Chol Calc (NIH): 68 mg/dL (ref 0–99)
Triglycerides: 79 mg/dL (ref 0–149)
VLDL Cholesterol Cal: 15 mg/dL (ref 5–40)

## 2023-10-14 ENCOUNTER — Encounter: Payer: Self-pay | Admitting: Family Medicine

## 2023-10-14 ENCOUNTER — Ambulatory Visit: Admitting: Family Medicine

## 2023-10-14 VITALS — BP 103/64 | HR 68 | Ht 63.0 in | Wt 147.6 lb

## 2023-10-14 DIAGNOSIS — R202 Paresthesia of skin: Secondary | ICD-10-CM | POA: Diagnosis not present

## 2023-10-14 DIAGNOSIS — M545 Low back pain, unspecified: Secondary | ICD-10-CM

## 2023-10-14 DIAGNOSIS — F32A Depression, unspecified: Secondary | ICD-10-CM | POA: Diagnosis not present

## 2023-10-14 DIAGNOSIS — F419 Anxiety disorder, unspecified: Secondary | ICD-10-CM

## 2023-10-14 NOTE — Patient Instructions (Addendum)
???? ?? ????? ????? ?????! ??? ?? ????? ???? ??? ?? ?? ??? ?????:  -?? ??? ????? ?? ??? ??? ????? ???? ???. ??? ???????? ?? ????? ???? ??????? ??? ????? ???? ? ?? ???? ???? ??????? ???? ????.   ??? ????? ??? ???? ??? ?? ?????? ????? ????? ?? ?????? ?? ????? (  [phone] ???? ??????.  ??????? ????? ?????  Thank you for coming in today! Here is a summary of what we discussed:  -Sounds like things are very stable. You can continue doing your exercises and taking Tylenol as needed.   Please call the clinic at [phone] if your symptoms worsen or you have any concerns.  Best, Dr Adele

## 2023-10-14 NOTE — Assessment & Plan Note (Signed)
 Patient did not mention any concerns currently.  Reports adherence with mirtazapine .  Will continue to monitor.

## 2023-10-14 NOTE — Progress Notes (Signed)
    SUBJECTIVE:   CHIEF COMPLAINT / HPI:  Visit conducted with assistance of in-person Dari interpreter  Back pain --has been doing PT therapy regularly, this helps --some days its worse, especially if she is walking a lot  L foot numbness --numbness in medial L foot only when she's upset --feels it has gotten better overall --has been taking Tylenol, which helps  Mood concerns --Was upset at last appointment after her son went back to [place] --Has been taking Mirtazapine  daily -- States she is feeling good now, does not have any mood concerns currently  PERTINENT  PMH / PSH: Chronic lumbar radiculopathy, refugee patient, anxiety, depression   OBJECTIVE:   BP 103/64   Pulse 68   Ht 5' 3 (1.6 m)   Wt 147 lb 9.6 oz (67 kg)   SpO2 99%   BMI 26.15 kg/m   - General: No acute distress. Awake and conversant. - Respiratory: Respirations are non-labored. - Skin: No visible rashes or ulcers. - Psych: Alert and oriented. Cooperative, Appropriate mood and affect, Normal judgment. - MSK: Normal ambulation. - Neuro: No gross neurologic deficits  ASSESSMENT/PLAN:   Assessment & Plan Lumbar back pain No new concerns today.  Advised continuation of at home exercises and as needed Tylenol.  Can have patient go back to sports medicine moving forward for facet injections if symptoms worsen. Paresthesia Stable, no evidence of neurologic deficits.  Appears to be somatic symptom as patient reports it only occurs when she is upset.  Will continue to monitor. Depression, unspecified depression type Anxiety Patient did not mention any concerns currently.  Reports adherence with mirtazapine .  Will continue to monitor.     Rea Raring, MD [facility]

## 2023-10-14 NOTE — Assessment & Plan Note (Signed)
 No new concerns today.  Advised continuation of at home exercises and as needed Tylenol.  Can have patient go back to sports medicine moving forward for facet injections if symptoms worsen.

## 2023-10-14 NOTE — Assessment & Plan Note (Signed)
 Stable, no evidence of neurologic deficits.  Appears to be somatic symptom as patient reports it only occurs when she is upset.  Will continue to monitor.

## 2023-10-28 ENCOUNTER — Telehealth: Payer: Self-pay | Admitting: Gastroenterology

## 2023-10-28 NOTE — Telephone Encounter (Signed)
 Patient's son came in requesting to make an appointment; however, he says his mother prefers a female doctor.  Would you consider switching this patient to Dr. Suzann if she is amenable?  Please let me know how you would like to proceed.  Thank you both.

## 2024-02-05 ENCOUNTER — Ambulatory Visit: Admitting: Family Medicine

## 2024-02-05 VITALS — BP 117/73 | HR 67 | Ht 63.0 in | Wt 148.0 lb

## 2024-02-05 DIAGNOSIS — R42 Dizziness and giddiness: Secondary | ICD-10-CM

## 2024-02-05 DIAGNOSIS — E785 Hyperlipidemia, unspecified: Secondary | ICD-10-CM

## 2024-02-05 LAB — POC SOFIA 2 FLU + SARS ANTIGEN FIA
Influenza A, POC: NEGATIVE
Influenza B, POC: NEGATIVE
SARS Coronavirus 2 Ag: NEGATIVE

## 2024-02-05 MED ORDER — ROSUVASTATIN CALCIUM 10 MG PO TABS: 10.0000 mg | ORAL_TABLET | Freq: Every day | ORAL | 3 refills | Status: AC

## 2024-02-05 NOTE — Assessment & Plan Note (Addendum)
 Refilled statin

## 2024-02-05 NOTE — Progress Notes (Signed)
    SUBJECTIVE:   CHIEF COMPLAINT / HPI:   Dari interpreter used for visit  Dizziness --has felt dizzy for 1 week, feels a spinning sensation --Seems to be worse when she stands up --no ear pain or drainage --mild headache that comes and goes --no congestion or sore throat --no N/V/D or abd pain --no numbness, tingling, weakness --thinks she had a fever 2 nights ago, son states she felt warm but does not know temperature --has been eating and drinking normally  Ran out of statin about 1 month ago, requests refill  Flu/COVID shot-declines today  PERTINENT  PMH / PSH: Reviewed  OBJECTIVE:   BP 117/73   Pulse 67   Ht 5' 3 (1.6 m)   Wt 148 lb (67.1 kg)   SpO2 100%   BMI 26.22 kg/m   General: Awake and conversant, no acute distress HEENT: normocephalic and atraumatic, MMM, strabismus, normal conjunctiva, normal TM BL, no cerumen impaction Pulm: normal work of breathing on room air Neuro: PEERLA, CN2-12 intact, normal strength and sensation Psych: Appropriate mood and affect   ASSESSMENT/PLAN:   Assessment & Plan Dizziness Flu/COVID swab negative.  Possible viral labyrinthitis vs BPPV.  Appears well overall exam today.  Advised conservative treatment with Tylenol or ibuprofen  as needed, rest, and good hydration.  Reviewed return precautions.  Close follow-up scheduled for December 1. Dyslipidemia Refilled statin     Rea Raring, MD [facility]

## 2024-02-05 NOTE — Patient Instructions (Addendum)
???? ?? ????? ????? ?????! ??? ?? ????? ???? ??? ?? ?? ??? ?????:  - ????? ??? ???? ?? ??? ?? ??? ?????? ????.  ?????? ???? ?? ??? ???????? ????? ???? ??? ??? ?? ????? ??????? ??? ??? ?????? ? ??? ???????? ??????? ????. ??? ?????? ???????? ?? ??????? ?? ?????????? ???? ????? ????? ??????? ????. ????? ??? ????? ??? ????? ????? ?? ???? ????? ?? ?? ????? ????. ??? ???? ??? ??? ?? ?????? ????? ???.  - ??? ??????? ??? ?? ??? ???? ????? ?? ?????? ?? ???? ?????? ? ?? ???????? ?????? ?? ???? ?? ????? ?????  ??? ????? ??? ???? ??? ?? ?????? ????? ????? ?? ?????? ?? ????? (  [phone] ???? ??????.  ??????? ????? ?????  Thank you for coming in today! Here is a summary of what we discussed:  - Your symptoms may be due to a viral illness.  Best thing you can do is stay hydrated, eat well and rest if you can. You can also use tylenol or ibuprofen  for symptom relief. Please let us  know if your symptoms change or get worse. I will see you again in a few weeks.  - If you are running low on medications, please contact our clinic and we can provide refills  Please call the clinic at [phone] if your symptoms worsen or you have any concerns.  Best, Dr Adele

## 2024-02-07 ENCOUNTER — Ambulatory Visit: Admitting: Family Medicine

## 2024-02-17 ENCOUNTER — Ambulatory Visit: Payer: Self-pay | Admitting: Family Medicine

## 2024-03-02 ENCOUNTER — Other Ambulatory Visit: Payer: Self-pay | Admitting: Family Medicine

## 2024-03-02 DIAGNOSIS — F4320 Adjustment disorder, unspecified: Secondary | ICD-10-CM
